# Patient Record
Sex: Female | Born: 1990 | Hispanic: No | Marital: Single | Smoking: Never smoker
Health system: Southern US, Community
[De-identification: ages and names within clinical notes are randomized; demographics above are authoritative.]

## PROBLEM LIST (undated history)

## (undated) ENCOUNTER — Inpatient Hospital Stay (HOSPITAL_COMMUNITY): Payer: Self-pay

## (undated) DIAGNOSIS — M8500 Fibrous dysplasia (monostotic), unspecified site: Secondary | ICD-10-CM

## (undated) DIAGNOSIS — D332 Benign neoplasm of brain, unspecified: Secondary | ICD-10-CM

## (undated) HISTORY — PX: OTHER SURGICAL HISTORY: SHX169

---
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
## 2011-02-08 ENCOUNTER — Encounter (HOSPITAL_COMMUNITY): Payer: Self-pay | Admitting: *Deleted

## 2011-02-08 ENCOUNTER — Emergency Department (INDEPENDENT_AMBULATORY_CARE_PROVIDER_SITE_OTHER)
Admission: EM | Admit: 2011-02-08 | Discharge: 2011-02-08 | Disposition: A | Discharge status: Home / Self Care | Payer: Medicaid Other | Source: Home / Self Care | Attending: Emergency Medicine | Admitting: Emergency Medicine

## 2011-02-08 DIAGNOSIS — J069 Acute upper respiratory infection, unspecified: Secondary | ICD-10-CM

## 2011-02-08 HISTORY — DX: Benign neoplasm of brain, unspecified: D33.2

## 2011-02-08 MED ORDER — IBUPROFEN 600 MG PO TABS: 600.0000 mg | ORAL_TABLET | Freq: Four times a day (QID) | ORAL | Status: AC | PRN

## 2011-02-08 MED ORDER — ALBUTEROL SULFATE HFA 108 (90 BASE) MCG/ACT IN AERS: 1.0000 | INHALATION_SPRAY | Freq: Four times a day (QID) | RESPIRATORY_TRACT | Status: DC | PRN

## 2011-02-08 MED ORDER — HYDROCODONE-HOMATROPINE 5-1.5 MG/5ML PO SYRP: 5.0000 mL | ORAL_SOLUTION | Freq: Four times a day (QID) | ORAL | Status: AC | PRN

## 2011-02-08 MED ORDER — FLUTICASONE PROPIONATE 50 MCG/ACT NA SUSP: 2.0000 | Freq: Every day | NASAL | Status: DC

## 2011-02-08 MED ORDER — PSEUDOEPHEDRINE-GUAIFENESIN ER 120-1200 MG PO TB12: 1.0000 | ORAL_TABLET | Freq: Two times a day (BID) | ORAL | Status: DC | PRN

## 2011-02-08 NOTE — ED Notes (Signed)
Pt  Has  Symptoms  Of  Cough  /  Congestion   Stuffy  Nose   As  Well  As  Sinus  Drainage           Symptoms  Of        Nasal  stuffyness  As  Well -  The  Onset  Of  Symptoms  Began      Yesterday        She  Is  Sitting  Upright on  Exam  Table  And  Appears  In no  Distress

## 2011-02-08 NOTE — ED Provider Notes (Signed)
History     CSN: [id number]  Arrival date & time 02/08/11  1438   First MD Initiated Contact with Patient 02/08/11 1548      Chief Complaint  Patient presents with  . Cough    (Consider location/radiation/quality/duration/timing/severity/associated sxs/prior treatment) HPI Comments: Patient with nasal congestion, rhinorrhea, postnasal drip, sore, irritated throat, nonproductive cough starting yesterday. Complains of general malaise, fatigue. Unable to sleep at night secondary to all of coughing. No wheezing, chest pain, shortness of breath. No ear pain, ear fullness. No fevers, abdominal pain. Patient did not get flu shot this year. Tried DayQuil, NyQuil with temporary relief. No known sick contacts.  ROS as noted in HPI. All other ROS negative.   Patient is a 21 y.o. female presenting with cough. The history is provided by the patient.  Cough This is a new problem. The current episode started yesterday. The cough is non-productive. There has been no fever. She has tried cough syrup for the symptoms. The treatment provided mild relief. She is not a smoker.    Past Medical History  Diagnosis Date  . Asthma   . Brain tumor (benign)     Past Surgical History  Procedure Date  . Cranial surgery     History reviewed. No pertinent family history.  History  Substance Use Topics  . Smoking status: Not on file  . Smokeless tobacco: Not on file  . Alcohol Use: No    OB History    Grav Para Term Preterm Abortions TAB SAB Ect Mult Living                  Review of Systems  Respiratory: Positive for cough.     Allergies  Review of patient's allergies indicates no known allergies.  Home Medications   Current Outpatient Rx  Name Route Sig Dispense Refill  . FLUTICASONE PROPIONATE 50 MCG/ACT NA SUSP Nasal Place 2 sprays into the nose daily. 16 g 0  . HYDROCODONE-HOMATROPINE 5-1.5 MG/5ML PO SYRP Oral Take 5 mLs by mouth every 6 (six) hours as needed for cough or pain. 120  mL 0  . IBUPROFEN 600 MG PO TABS Oral Take 1 tablet (600 mg total) by mouth every 6 (six) hours as needed for pain. 30 tablet 0  . PSEUDOEPHEDRINE-GUAIFENESIN ER [phone] MG PO TB12 Oral Take 1 tablet by mouth 2 (two) times daily as needed (congestion). 20 each 0    BP 123/84  Pulse 94  Temp(Src) 99.2 F (37.3 C) (Oral)  Resp 18  SpO2 99%  LMP 01/20/2011  Physical Exam  Nursing note and vitals reviewed. Constitutional: She is oriented to person, place, and time. She appears well-developed and well-nourished.  HENT:  Head: Normocephalic and atraumatic.  Right Ear: Tympanic membrane and ear canal normal.  Left Ear: Tympanic membrane and ear canal normal.  Nose: Mucosal edema and rhinorrhea present. No epistaxis.  Mouth/Throat: Uvula is midline and mucous membranes are normal. Posterior oropharyngeal erythema present. No oropharyngeal exudate.       (-) frontal, maxillary sinus tenderness  Eyes: Conjunctivae and EOM are normal. Pupils are equal, round, and reactive to light.  Neck: Normal range of motion. Neck supple.  Cardiovascular: Normal rate, regular rhythm and normal heart sounds.   Pulmonary/Chest: Effort normal and breath sounds normal. No respiratory distress. She has no wheezes. She has no rales. She exhibits no tenderness.  Abdominal: Soft. Bowel sounds are normal. She exhibits no distension. There is no tenderness.  Musculoskeletal: Normal range of motion.  Lymphadenopathy:    She has no cervical adenopathy.  Neurological: She is alert and oriented to person, place, and time.  Skin: Skin is warm and dry. No rash noted.  Psychiatric: She has a normal mood and affect. Her behavior is normal. Judgment and thought content normal.    ED Course  Procedures (including critical care time)  Labs Reviewed - No data to display No results found.   1. URI (upper respiratory infection)       MDM  Patient denies possibility of being pregnant. Will send her home with  albuterol inhaler in addition to decongestions, nasal steroids,. patient with history of asthma, and does not have an albuterol inhaler at home.  Luiz Blare, MD 02/08/11 [phone]

## 2011-06-07 ENCOUNTER — Telehealth: Payer: Self-pay | Admitting: *Deleted

## 2011-06-07 ENCOUNTER — Encounter: Payer: Self-pay | Admitting: Family Medicine

## 2011-06-07 ENCOUNTER — Ambulatory Visit (INDEPENDENT_AMBULATORY_CARE_PROVIDER_SITE_OTHER): Payer: Medicaid Other | Admitting: Family Medicine

## 2011-06-07 VITALS — BP 121/81 | HR 82 | Temp 99.0°F | Ht 66.61 in | Wt 145.4 lb

## 2011-06-07 DIAGNOSIS — G43909 Migraine, unspecified, not intractable, without status migrainosus: Secondary | ICD-10-CM

## 2011-06-07 DIAGNOSIS — R55 Syncope and collapse: Secondary | ICD-10-CM

## 2011-06-07 DIAGNOSIS — Z23 Encounter for immunization: Secondary | ICD-10-CM

## 2011-06-07 LAB — BASIC METABOLIC PANEL
CO2: 27 mEq/L (ref 19–32)
Chloride: 103 mEq/L (ref 96–112)
Sodium: 139 mEq/L (ref 135–145)

## 2011-06-07 LAB — CBC
MCV: 82.9 fL (ref 78.0–100.0)
Platelets: 317 10*3/uL (ref 150–400)

## 2011-06-07 MED ORDER — SUMATRIPTAN SUCCINATE 50 MG PO TABS: 50.0000 mg | ORAL_TABLET | ORAL | Status: DC | PRN

## 2011-06-07 NOTE — Assessment & Plan Note (Addendum)
One episode with hot sensation while prolonged standing and having hair cut- likely vasovagal- no headache assoc. She has no cardiac history and has no palpitations. Never has syncope or dizziness prior. Will watch this for now, if it happens again may need to do a cardiac workup.

## 2011-06-07 NOTE — Telephone Encounter (Signed)
PA required for sumatriptan. Form placed in MD box. 

## 2011-06-07 NOTE — Assessment & Plan Note (Signed)
Classic migraine symptoms and headache, Advised on abortive medications Reviewed migraine triggers Will rx imitrex

## 2011-06-07 NOTE — Patient Instructions (Signed)
It was great to see you today!  Schedule an appointment to see me in 2 weeks.  I think you have migraine headaches.  I have checked your lab levels for your blood count and your metabolic panel because of your fainting.  I have given you the tetanus vaccine today.

## 2011-06-08 ENCOUNTER — Encounter: Payer: Self-pay | Admitting: Family Medicine

## 2011-06-08 NOTE — Telephone Encounter (Signed)
PA approval faxed to pharmacy

## 2011-06-08 NOTE — Progress Notes (Signed)
  Subjective:  1. Migraine headache (first visit)  Maria Combs is a 21 y.o. female who presents for evaluation of headache. Symptoms began about 2 weeks ago. Generally, the headaches last about 2 hours and occur every day. The headaches do not seem to be related to any time of the day. The headaches are usually throbbing and are located in right temporal region.  The patient rates her most severe headaches a 8 on a scale from 1 to 10. Recently, the headaches have been increasing in frequency and severity. Work attendance or other daily activities are not affected by the headaches. Precipitating factors include: none which have been determined and asked about caffeine, sleep, nsaid use, alcohol use, smoking use. . The headaches are usually preceded by an aura consisting of she wasnt sure on this. Associated neurologic symptoms: none. The patient denies dizziness, loss of balance, numbness of extremities, speech difficulties, vision problems and vomiting in the early morning. Home treatment has included acetaminophen and ibuprofen with little improvement. Other history includes: nothing pertinent. Family history includes no known family members with significant headaches. She has a childhood history of intracranial surgery with acrylic plate under scalp (but this has never been a factor for headaches or seizures.- vague history without records) Headaches associated with photophobia, loud noise sensitivity, nausea.  Review of Systems Pertinent items are noted in HPI.    Objective:   Filed Vitals:   06/07/11 1141  BP: 121/81  Pulse: 82  Temp: 99 F (37.2 C)  TempSrc: Oral  Height: 5' 6.61" (1.692 m)  Weight: 145 lb 6 oz (65.942 kg)  Lungs:  Normal respiratory effort, chest expands symmetrically. Lungs are clear to auscultation, no crackles or wheezes. Heart - Regular rate and rhythm.  No murmurs, gallops or rubs.    Neck:  No deformities, thyromegaly, masses, or tenderness noted.   Supple  with full range of motion without pain. Fundi: normal, no vessel abnormalities. Neurologic exam : Cn 2-7 intact Strength equal & normal in upper & lower extremities Able to walk on heels and toes.   Balance normal  Romberg normal, finger to nose  EOMI, PERRLA  Assessment:

## 2011-06-21 ENCOUNTER — Ambulatory Visit: Payer: Medicaid Other | Admitting: Family Medicine

## 2012-02-29 ENCOUNTER — Encounter (HOSPITAL_COMMUNITY): Payer: Self-pay | Admitting: *Deleted

## 2012-02-29 ENCOUNTER — Emergency Department (INDEPENDENT_AMBULATORY_CARE_PROVIDER_SITE_OTHER)
Admission: EM | Admit: 2012-02-29 | Discharge: 2012-02-29 | Disposition: A | Discharge status: Home / Self Care | Payer: Medicaid Other | Source: Home / Self Care | Attending: Family Medicine | Admitting: Family Medicine

## 2012-02-29 DIAGNOSIS — J069 Acute upper respiratory infection, unspecified: Secondary | ICD-10-CM

## 2012-02-29 MED ORDER — IPRATROPIUM BROMIDE 0.06 % NA SOLN: 2.0000 | Freq: Four times a day (QID) | NASAL | Status: DC

## 2012-02-29 MED ORDER — ALBUTEROL SULFATE HFA 108 (90 BASE) MCG/ACT IN AERS: 1.0000 | INHALATION_SPRAY | Freq: Four times a day (QID) | RESPIRATORY_TRACT | Status: DC | PRN

## 2012-02-29 MED ORDER — HYDROCOD POLST-CHLORPHEN POLST 10-8 MG/5ML PO LQCR: 5.0000 mL | Freq: Two times a day (BID) | ORAL | Status: DC | PRN

## 2012-02-29 NOTE — ED Notes (Signed)
Pt  Reports   Symptoms  Of  A  Cough    As well  As  Headache  /  Nausea            With  Chest  Pain associated    with the  Cough  At intervals     -   She  Reports  A  History  Of  Ar=sthma   In past   However she  Appears  In no  Acute  Distress

## 2012-02-29 NOTE — ED Provider Notes (Signed)
History     CSN: [id number]  Arrival date & time 02/29/12  1406   First MD Initiated Contact with Patient 02/29/12 1421      Chief Complaint  Patient presents with  . Cough    (Consider location/radiation/quality/duration/timing/severity/associated sxs/prior treatment) Patient is a 22 y.o. female presenting with cough. The history is provided by the patient.  Cough Cough characteristics:  Non-productive Severity:  Mild Duration:  2 weeks Progression:  Unchanged Chronicity:  New Smoker: no   Associated symptoms: chest pain and rhinorrhea   Associated symptoms: no fever and no shortness of breath     Past Medical History  Diagnosis Date  . Asthma   . Brain tumor (benign)     Past Surgical History  Procedure Laterality Date  . Cranial surgery      No family history on file.  History  Substance Use Topics  . Smoking status: Never Smoker   . Smokeless tobacco: Not on file  . Alcohol Use: No    OB History   Grav Para Term Preterm Abortions TAB SAB Ect Mult Living                  Review of Systems  Constitutional: Negative.  Negative for fever.  HENT: Positive for congestion, rhinorrhea and postnasal drip.   Respiratory: Positive for cough. Negative for shortness of breath.   Cardiovascular: Positive for chest pain.  Gastrointestinal: Negative.     Allergies  Review of patient's allergies indicates no known allergies.  Home Medications   Current Outpatient Rx  Name  Route  Sig  Dispense  Refill  . EXPIRED: albuterol (PROVENTIL HFA;VENTOLIN HFA) 108 (90 BASE) MCG/ACT inhaler   Inhalation   Inhale 1-2 puffs into the lungs every 6 (six) hours as needed for wheezing.   1 Inhaler   0   . albuterol (PROVENTIL HFA;VENTOLIN HFA) 108 (90 BASE) MCG/ACT inhaler   Inhalation   Inhale 1-2 puffs into the lungs every 6 (six) hours as needed for wheezing.   1 Inhaler   0   . chlorpheniramine-HYDROcodone (TUSSIONEX PENNKINETIC ER) 10-8 MG/5ML LQCR   Oral  Take 5 mLs by mouth every 12 (twelve) hours as needed.   115 mL   0   . EXPIRED: fluticasone (FLONASE) 50 MCG/ACT nasal spray   Nasal   Place 2 sprays into the nose daily.   16 g   0   . ipratropium (ATROVENT) 0.06 % nasal spray   Nasal   Place 2 sprays into the nose 4 (four) times daily.   15 mL   1   . Pseudoephedrine-Guaifenesin (MUCINEX D) [phone] MG TB12   Oral   Take 1 tablet by mouth 2 (two) times daily as needed (congestion).   20 each   0   . SUMAtriptan (IMITREX) 50 MG tablet   Oral   Take 1 tablet (50 mg total) by mouth every 2 (two) hours as needed for migraine.   30 tablet   1     BP 117/82  Pulse 84  Temp(Src) 99.4 F (37.4 C) (Oral)  Resp 18  SpO2 98%  Physical Exam  Nursing note and vitals reviewed. Constitutional: She is oriented to person, place, and time. She appears well-developed and well-nourished.  HENT:  Head: Normocephalic.  Right Ear: External ear normal.  Left Ear: External ear normal.  Nose: Nose normal.  Mouth/Throat: Oropharynx is clear and moist.  Eyes: Conjunctivae are normal. Pupils are equal, round, and reactive to light.  Neck: Normal range of motion. Neck supple.  Cardiovascular: Normal rate, regular rhythm, normal heart sounds and intact distal pulses.   Pulmonary/Chest: Effort normal and breath sounds normal.  Lymphadenopathy:    She has no cervical adenopathy.  Neurological: She is alert and oriented to person, place, and time.  Skin: Skin is warm and dry.    ED Course  Procedures (including critical care time)  Labs Reviewed - No data to display No results found.   1. URI (upper respiratory infection)       MDM          Linna Hoff, MD 02/29/12 1529

## 2012-08-12 ENCOUNTER — Encounter: Payer: Self-pay | Admitting: Family Medicine

## 2012-08-12 ENCOUNTER — Other Ambulatory Visit (HOSPITAL_COMMUNITY)
Admission: RE | Admit: 2012-08-12 | Discharge: 2012-08-12 | Disposition: A | Discharge status: Home / Self Care | Payer: Medicaid Other | Source: Ambulatory Visit | Attending: Family Medicine | Admitting: Family Medicine

## 2012-08-12 ENCOUNTER — Ambulatory Visit (INDEPENDENT_AMBULATORY_CARE_PROVIDER_SITE_OTHER): Payer: Medicaid Other | Admitting: Family Medicine

## 2012-08-12 VITALS — BP 124/71 | HR 76 | Temp 98.3°F | Wt 149.0 lb

## 2012-08-12 DIAGNOSIS — Z0000 Encounter for general adult medical examination without abnormal findings: Secondary | ICD-10-CM

## 2012-08-12 DIAGNOSIS — Z113 Encounter for screening for infections with a predominantly sexual mode of transmission: Secondary | ICD-10-CM | POA: Insufficient documentation

## 2012-08-12 DIAGNOSIS — Z309 Encounter for contraceptive management, unspecified: Secondary | ICD-10-CM

## 2012-08-12 DIAGNOSIS — Z01419 Encounter for gynecological examination (general) (routine) without abnormal findings: Secondary | ICD-10-CM

## 2012-08-12 MED ORDER — ALBUTEROL SULFATE HFA 108 (90 BASE) MCG/ACT IN AERS: 1.0000 | INHALATION_SPRAY | Freq: Four times a day (QID) | RESPIRATORY_TRACT | Status: DC | PRN

## 2012-08-12 MED ORDER — MEDROXYPROGESTERONE ACETATE 150 MG/ML IM SUSP
150.0000 mg | Freq: Once | INTRAMUSCULAR | Status: AC
  Administered 2012-08-12: 150 mg via INTRAMUSCULAR

## 2012-08-12 NOTE — Addendum Note (Signed)
Addended by: Henri Medal on: 08/12/2012 10:46 AM   Modules accepted: Orders

## 2012-08-12 NOTE — Progress Notes (Signed)
Patient ID: Maria Combs, female   DOB: Dec 08, 1990, 22 y.o.   MRN: [id number] Subjective:     Maria Combs is a 22 y.o. female and is here for a comprehensive physical exam. The patient reports no problems.  LMP: Usually regular but started earlier 07/27/12. OB/GYN:  G0P0. Sexually active for 2 yrs, uses condoms regularly. Birth control:was on a pill 6 months ago,stopped since it made her feel sick,usually forget to take medication. Had Implanon in the past,made her feel weak.She will like to try Depo shot. No prior STI,UTI 2 yrs ago. Prior hx of vaginal discharge but non currently. She also endorsed pelvic pain on and off not related to her menses,currently denies any pain. She has had episodes of rectal pain with her menses as well.  She is studying accounting and business admin in school. No form of abuse,no stress.  History   Social History  . Marital Status: Single    Spouse Name: N/A    Number of Children: N/A  . Years of Education: N/A   Occupational History  . Not on file.   Social History Main Topics  . Smoking status: Never Smoker   . Smokeless tobacco: Not on file  . Alcohol Use: No  . Drug Use: Not on file  . Sexually Active: Yes    Birth Control/ Protection: None   Other Topics Concern  . Not on file   Social History Narrative  . No narrative on file   Health Maintenance  Topic Date Due  . Chlamydia Screening  11/29/2006  . Pap Smear  06/07/2013  . Influenza Vaccine  09/15/2012  . Tetanus/tdap  06/06/2021    The following portions of the patient's history were reviewed and updated as appropriate: allergies, current medications, past family history, past medical history, past social history, past surgical history and problem list.  Review of Systems Pertinent items are noted in HPI.   Objective:    BP 124/71  Pulse 76  Temp(Src) 98.3 F (36.8 C) (Oral)  Wt 149 lb (67.586 kg)  BMI 23.61 kg/m2  LMP 08/05/2012 General appearance: alert,  cooperative and appears stated age Head: Normocephalic, without obvious abnormality, atraumatic Eyes: conjunctivae/corneas clear. PERRL, EOM's intact. Fundi benign. Ears: normal TM's and external ear canals both ears Nose: Nares normal. Septum midline. Mucosa normal. No drainage or sinus tenderness. Throat: lips, mucosa, and tongue normal; teeth and gums normal Neck: no adenopathy, no carotid bruit, no JVD, supple, symmetrical, trachea midline and thyroid not enlarged, symmetric, no tenderness/mass/nodules Lungs: clear to auscultation bilaterally Heart: regular rate and rhythm, S1, S2 normal, no murmur, click, rub or gallop Abdomen: soft, non-tender; bowel sounds normal; no masses,  no organomegaly Pelvic: deferred and prefer urine specimen for GC/Chlamydia testing. Pelvic exam offered. Extremities: extremities normal, atraumatic, no cyanosis or edema Skin: Skin color, texture, turgor normal. No rashes or lesions Neurologic: Grossly normal    Assessment:    Healthy female exam. Normal exam      Plan:  Counseling done on birth control, she prefer to start Depo. S/E of birth control discussed and need to use alternative birth control for about 1-2 wks. Pregnancy test done today was negative. Depo shot given. STI counseling done, she agreed to HIV,RPR,GC/Chlamydia testing today. Pelvic pain and rectal pain might be ovarian cyst vs endometriosis, we will readdress this at next visit if persistent. F/U in 3 month prn for Depo shot. Next Depo shot should be between Sept 22- Oct 6.   See  After Visit Summary for Counseling Recommendations

## 2012-08-12 NOTE — Patient Instructions (Addendum)
Medroxyprogesterone injection [Contraceptive]  Next Depo shot should be between Sept 22- Oct 6. What is this medicine? MEDROXYPROGESTERONE (me DROX ee proe JES te rone) contraceptive injections prevent pregnancy. They provide effective birth control for 3 months. Depo-subQ Provera 104 is also used for treating pain related to endometriosis. This medicine may be used for other purposes; ask your health care provider or pharmacist if you have questions. What should I tell my health care provider before I take this medicine? They need to know if you have any of these conditions: -frequently drink alcohol -asthma -blood vessel disease or a history of a blood clot in the lungs or legs -bone disease such as osteoporosis -breast cancer -diabetes -eating disorder (anorexia nervosa or bulimia) -high blood pressure -HIV infection or AIDS -kidney disease -liver disease -mental depression -migraine -seizures (convulsions) -stroke -tobacco smoker -vaginal bleeding -an unusual or allergic reaction to medroxyprogesterone, other hormones, medicines, foods, dyes, or preservatives -pregnant or trying to get pregnant -breast-feeding How should I use this medicine? Depo-Provera Contraceptive injection is given into a muscle. Depo-subQ Provera 104 injection is given under the skin. These injections are given by a health care professional. You must not be pregnant before getting an injection. The injection is usually given during the first 5 days after the start of a menstrual period or 6 weeks after delivery of a baby. Talk to your pediatrician regarding the use of this medicine in children. Special care may be needed. These injections have been used in female children who have started having menstrual periods. Overdosage: If you think you have taken too much of this medicine contact a poison control center or emergency room at once. NOTE: This medicine is only for you. Do not share this medicine with  others. What if I miss a dose? Try not to miss a dose. You must get an injection once every 3 months to maintain birth control. If you cannot keep an appointment, call and reschedule it. If you wait longer than 13 weeks between Depo-Provera contraceptive injections or longer than 14 weeks between Depo-subQ Provera 104 injections, you could get pregnant. Use another method for birth control if you miss your appointment. You may also need a pregnancy test before receiving another injection. What may interact with this medicine? Do not take this medicine with any of the following medications: -bosentan This medicine may also interact with the following medications: -aminoglutethimide -antibiotics or medicines for infections, especially rifampin, rifabutin, rifapentine, and griseofulvin -aprepitant -barbiturate medicines such as phenobarbital or primidone -bexarotene -carbamazepine -medicines for seizures like ethotoin, felbamate, oxcarbazepine, phenytoin, topiramate -modafinil -St. John's wort This list may not describe all possible interactions. Give your health care provider a list of all the medicines, herbs, non-prescription drugs, or dietary supplements you use. Also tell them if you smoke, drink alcohol, or use illegal drugs. Some items may interact with your medicine. What should I watch for while using this medicine? This drug does not protect you against HIV infection (AIDS) or other sexually transmitted diseases. Use of this product may cause you to lose calcium from your bones. Loss of calcium may cause weak bones (osteoporosis). Only use this product for more than 2 years if other forms of birth control are not right for you. The longer you use this product for birth control the more likely you will be at risk for weak bones. Ask your health care professional how you can keep strong bones. You may have a change in bleeding pattern or irregular periods. Many  females stop having periods  while taking this drug. If you have received your injections on time, your chance of being pregnant is very low. If you think you may be pregnant, see your health care professional as soon as possible. Tell your health care professional if you want to get pregnant within the next year. The effect of this medicine may last a long time after you get your last injection. What side effects may I notice from receiving this medicine? Side effects that you should report to your doctor or health care professional as soon as possible: -allergic reactions like skin rash, itching or hives, swelling of the face, lips, or tongue -breast tenderness or discharge -breathing problems -changes in vision -depression -feeling faint or lightheaded, falls -fever -pain in the abdomen, chest, groin, or leg -problems with balance, talking, walking -unusually weak or tired -yellowing of the eyes or skin Side effects that usually do not require medical attention (report to your doctor or health care professional if they continue or are bothersome): -acne -fluid retention and swelling -headache -irregular periods, spotting, or absent periods -temporary pain, itching, or skin reaction at site where injected -weight gain This list may not describe all possible side effects. Call your doctor for medical advice about side effects. You may report side effects to FDA at 1-800-FDA-1088. Where should I keep my medicine? This does not apply. The injection will be given to you by a health care professional. NOTE: This sheet is a summary. It may not cover all possible information. If you have questions about this medicine, talk to your doctor, pharmacist, or health care provider.  2013, [employer]. (01/23/2008 6:37:56 PM)

## 2012-08-13 LAB — RPR

## 2012-09-25 ENCOUNTER — Ambulatory Visit (INDEPENDENT_AMBULATORY_CARE_PROVIDER_SITE_OTHER): Payer: Medicaid Other | Admitting: Family Medicine

## 2012-09-25 ENCOUNTER — Encounter: Payer: Self-pay | Admitting: Family Medicine

## 2012-09-25 VITALS — BP 115/77 | HR 89 | Temp 98.9°F | Ht 66.0 in | Wt 154.0 lb

## 2012-09-25 DIAGNOSIS — R109 Unspecified abdominal pain: Secondary | ICD-10-CM

## 2012-09-25 DIAGNOSIS — H60399 Other infective otitis externa, unspecified ear: Secondary | ICD-10-CM

## 2012-09-25 DIAGNOSIS — R51 Headache: Secondary | ICD-10-CM

## 2012-09-25 DIAGNOSIS — H60391 Other infective otitis externa, right ear: Secondary | ICD-10-CM

## 2012-09-25 DIAGNOSIS — Z23 Encounter for immunization: Secondary | ICD-10-CM

## 2012-09-25 LAB — POCT URINALYSIS DIPSTICK
Glucose, UA: NEGATIVE
Ketones, UA: NEGATIVE
Leukocytes, UA: NEGATIVE
Nitrite, UA: NEGATIVE
Protein, UA: NEGATIVE
Spec Grav, UA: 1.01
Urobilinogen, UA: 0.2

## 2012-09-25 LAB — POCT UA - MICROSCOPIC ONLY

## 2012-09-25 MED ORDER — MELOXICAM 7.5 MG PO TABS: 7.5000 mg | ORAL_TABLET | Freq: Every day | ORAL | Status: DC

## 2012-09-25 MED ORDER — CIPROFLOXACIN-DEXAMETHASONE 0.3-0.1 % OT SUSP: 4.0000 [drp] | Freq: Two times a day (BID) | OTIC | Status: DC

## 2012-09-25 NOTE — Assessment & Plan Note (Signed)
Currently asymptomatic. Might be related to stress vs otalgia induced headache. Tylenol prn headache.

## 2012-09-25 NOTE — Progress Notes (Signed)
Subjective:     Patient ID: Maria Combs, female   DOB: August 19, 1990, 22 y.o.   MRN: [id number]  HPI Back pain: Burning sensation on both flanks,more than 3wks ago,about 4/10 in severity.No fall or injury. Trigger unknown. No urine symptoms. No fever,no vaginal discharge.Vibration while driving.Ibuprofen 400 mg did not help. Headache:Few days ago.Not currently,nauseous 2 days ago,no vomiting.Her sister has stomach virus. Earache: Right earache for 4wks on and off. Not current.Denies hearing loss,no ear discharge,no fever. Vaccination: need flu shot.  Current Outpatient Prescriptions on File Prior to Visit  Medication Sig Dispense Refill  . albuterol (PROVENTIL HFA;VENTOLIN HFA) 108 (90 BASE) MCG/ACT inhaler Inhale 1-2 puffs into the lungs every 6 (six) hours as needed for wheezing.  1 Inhaler  5  . [DISCONTINUED] clindamycin-benzoyl peroxide (BENZACLIN) gel Apply topically 2 (two) times daily. to affected areas for acne       . [DISCONTINUED] Eflornithine HCl (VANIQA) 13.9 % cream Apply topically 2 (two) times daily. to affected area at least 8 hours apart       . [DISCONTINUED] Etonogestrel (IMPLANON) 68 MG IMPL Inject into the skin.         No current facility-administered medications on file prior to visit.   Past Medical History  Diagnosis Date  . Brain tumor (benign)   . Asthma      Review of Systems  Respiratory: Negative.   Cardiovascular: Negative.   Gastrointestinal: Negative.   Genitourinary: Positive for flank pain. Negative for dysuria, frequency and hematuria.  All other systems reviewed and are negative.   Filed Vitals:   09/25/12 1441  BP: 115/77  Pulse: 89  Temp: 98.9 F (37.2 C)  TempSrc: Oral  Height: 5\' 6"  (1.676 m)  Weight: 154 lb (69.854 kg)       Objective:   Physical Exam  Nursing note and vitals reviewed. Constitutional: She appears well-developed. No distress.  HENT:  Head: Normocephalic.  Right Ear: Tympanic membrane normal. There is  drainage and tenderness.  Left Ear: Tympanic membrane normal. No drainage.  Ears:  Cardiovascular: Normal rate, regular rhythm, normal heart sounds and intact distal pulses.   No murmur heard. Pulmonary/Chest: Effort normal and breath sounds normal. No respiratory distress. She has no wheezes.  Abdominal: Soft. Bowel sounds are normal. She exhibits no distension and no mass. There is no splenomegaly or hepatomegaly. There is no tenderness. There is no guarding and no CVA tenderness.       Assessment/Plan:     Flank pain: R/O Kidney infection although unlikely. Headache : currently asymptomatic Earache: Otitis externa  Influenza vaccine

## 2012-09-25 NOTE — Assessment & Plan Note (Signed)
To start Ciprodex. F/U prn.

## 2012-09-25 NOTE — Assessment & Plan Note (Signed)
R/O Kidney infection although unlikely.?? Muscle spasm. UA obtained,will treat if positive for infection. Mobic for pain. F/U in 2 wks if no improvement,to consider xray and PT then if no improvement.

## 2012-09-25 NOTE — Patient Instructions (Addendum)

## 2012-09-25 NOTE — Assessment & Plan Note (Signed)
Flu shot offered,patient agreed with shot. Will give today.

## 2012-09-25 NOTE — Addendum Note (Signed)
Addended by: [place], Alfretta Pinch on: 09/25/2012 03:33 PM   Modules accepted: Orders

## 2012-09-30 ENCOUNTER — Telehealth: Payer: Self-pay | Admitting: Family Medicine

## 2012-09-30 NOTE — Telephone Encounter (Signed)
After careful review of Dr. Phebe Colla notes from recent visit, it is unclear to me that MRI is indicated.  I ask that patient be seen and re-evaluated specifically for this problem. JB

## 2012-09-30 NOTE — Telephone Encounter (Signed)
The Melex[place] is not working. Maria Combs said if the medicine did not work, she would suggest a MRI Please advise

## 2012-09-30 NOTE — Telephone Encounter (Signed)
No answer and no machine.  Will await callback. Zaxton Angerer Dawn  

## 2012-11-14 ENCOUNTER — Ambulatory Visit (INDEPENDENT_AMBULATORY_CARE_PROVIDER_SITE_OTHER): Payer: Medicaid Other | Admitting: *Deleted

## 2012-11-14 DIAGNOSIS — Z309 Encounter for contraceptive management, unspecified: Secondary | ICD-10-CM

## 2012-11-14 MED ORDER — MEDROXYPROGESTERONE ACETATE 150 MG/ML IM SUSP
150.0000 mg | Freq: Once | INTRAMUSCULAR | Status: AC
  Administered 2012-11-14: 150 mg via INTRAMUSCULAR

## 2012-11-14 NOTE — Progress Notes (Signed)
Patient in today for depo. Patient 3 days late so a urine pregnancy test was obtained with negative results. Injection given in the left deltoid, no complaints noted, site unremarkable. Next depo due January 16 - January 30, patient aware.

## 2013-05-15 ENCOUNTER — Encounter: Payer: Self-pay | Admitting: Internal Medicine

## 2013-05-15 ENCOUNTER — Ambulatory Visit: Payer: Self-pay | Attending: Internal Medicine | Admitting: Internal Medicine

## 2013-05-15 VITALS — BP 106/70 | HR 73 | Temp 98.5°F | Resp 16 | Ht 66.5 in | Wt 173.0 lb

## 2013-05-15 DIAGNOSIS — J029 Acute pharyngitis, unspecified: Secondary | ICD-10-CM | POA: Insufficient documentation

## 2013-05-15 DIAGNOSIS — H6120 Impacted cerumen, unspecified ear: Secondary | ICD-10-CM | POA: Insufficient documentation

## 2013-05-15 DIAGNOSIS — H6122 Impacted cerumen, left ear: Secondary | ICD-10-CM

## 2013-05-15 LAB — POCT RAPID STREP A (OFFICE): Rapid Strep A Screen: NEGATIVE

## 2013-05-15 MED ORDER — PENICILLIN V POTASSIUM 500 MG PO TABS: 500.0000 mg | ORAL_TABLET | Freq: Two times a day (BID) | ORAL | Status: DC

## 2013-05-15 MED ORDER — PENICILLIN V POTASSIUM 250 MG PO TABS: 500.0000 mg | ORAL_TABLET | Freq: Two times a day (BID) | ORAL | Status: DC

## 2013-05-15 NOTE — Progress Notes (Signed)
Patient here to establish care. States 1 week history of sore throat and right earache.

## 2013-05-15 NOTE — Progress Notes (Signed)
Patient ID: Maria Combs, female   DOB: Jul 20, 1990, 23 y.o.   MRN: [id number]   EAR PAIN  Location: right  Ear pain started: 1 week Pain is: sharp stabbing pain  Severity: moderate  Medications tried: motrin, alka seltzer cold and flu Recent ear trauma: no Prior ear surgeries: no Antibiotics in the last 30 days: no History of diabetes: no  Symptoms Ear discharge: no Fever: yes Pain with chewing: no Ringing in ears: no Dizziness: no Hearing loss: no Rashes or blisters around ear: no Weight loss: no  SORE THROAT  Sore throat began 7 days ago. Pain is: dry Severity: moderate Medications tried: cold and flu meds Strep throat exposure: no STD exposure: no  Symptoms Fever: yes Cough: yes, occasional from post nasal drainage Runny nose: no Muscle aches: yes Swollen Glands: yes Trouble breathing: mild, swollen throat Drooling: no Weight loss: no LMP: 4/16  Review of Symptoms - see HPI PMH - Smoking status noted.    Physical Exam  Constitutional: She is well-developed, well-nourished, and in no distress.  HENT:  Head: Normocephalic.  Right Ear: External ear normal.  Mouth/Throat: Oropharyngeal exudate and posterior oropharyngeal erythema present.  Eyes: Conjunctivae are normal. Pupils are equal, round, and reactive to light. No scleral icterus.  Neck: Normal range of motion.  Cardiovascular: Normal rate, regular rhythm and normal heart sounds.   Pulmonary/Chest: Effort normal and breath sounds normal.  Abdominal: Soft. Bowel sounds are normal.  Lymphadenopathy:    She has cervical adenopathy.  Neurological: She is alert.  Skin: Skin is warm and dry. No rash noted.  Psychiatric: Mood and affect normal.   Anwita was seen today for establish care, sore throat and otalgia.  Diagnoses and associated orders for this visit:  Impacted cerumen of left ear - Ear Lavage - Rapid Strep A  Acute pharyngitis - penicillin v potassium (VEETID) 500 MG tablet; Take  1 tablet (500 mg total) by mouth 2 (two) times daily. Throat culture sent, will call with results.  May gargle in warm salt water to help with pain.    Return if symptoms worsen or fail to improve.  Chari Manning, NP 05/15/2013 12:42 PM

## 2013-05-15 NOTE — Patient Instructions (Signed)
Strep Throat  Strep throat is an infection of the throat caused by a bacteria named Streptococcus pyogenes. Your caregiver may call the infection streptococcal "tonsillitis" or "pharyngitis" depending on whether there are signs of inflammation in the tonsils or back of the throat. Strep throat is most common in children aged 22 15 years during the cold months of the year, but it can occur in people of any age during any season. This infection is spread from person to person (contagious) through coughing, sneezing, or other close contact.  SYMPTOMS   · Fever or chills.  · Painful, swollen, red tonsils or throat.  · Pain or difficulty when swallowing.  · White or yellow spots on the tonsils or throat.  · Swollen, tender lymph nodes or "glands" of the neck or under the jaw.  · Red rash all over the body (rare).  DIAGNOSIS   Many different infections can cause the same symptoms. A test must be done to confirm the diagnosis so the right treatment can be given. A "rapid strep test" can help your caregiver make the diagnosis in a few minutes. If this test is not available, a light swab of the infected area can be used for a throat culture test. If a throat culture test is done, results are usually available in a day or two.  TREATMENT   Strep throat is treated with antibiotic medicine.  HOME CARE INSTRUCTIONS   · Gargle with 1 tsp of salt in 1 cup of warm water, 3 4 times per day or as needed for comfort.  · Family members who also have a sore throat or fever should be tested for strep throat and treated with antibiotics if they have the strep infection.  · Make sure everyone in your household washes their hands well.  · Do not share food, drinking cups, or personal items that could cause the infection to spread to others.  · You may need to eat a soft food diet until your sore throat gets better.  · Drink enough water and fluids to keep your urine clear or pale yellow. This will help prevent dehydration.  · Get plenty of  rest.  · Stay home from school, daycare, or work until you have been on antibiotics for 24 hours.  · Only take over-the-counter or prescription medicines for pain, discomfort, or fever as directed by your caregiver.  · If antibiotics are prescribed, take them as directed. Finish them even if you start to feel better.  SEEK MEDICAL CARE IF:   · The glands in your neck continue to enlarge.  · You develop a rash, cough, or earache.  · You cough up green, yellow-brown, or bloody sputum.  · You have pain or discomfort not controlled by medicines.  · Your problems seem to be getting worse rather than better.  SEEK IMMEDIATE MEDICAL CARE IF:   · You develop any new symptoms such as vomiting, severe headache, stiff or painful neck, chest pain, shortness of breath, or trouble swallowing.  · You develop severe throat pain, drooling, or changes in your voice.  · You develop swelling of the neck, or the skin on the neck becomes red and tender.  · You have a fever.  · You develop signs of dehydration, such as fatigue, dry mouth, and decreased urination.  · You become increasingly sleepy, or you cannot wake up completely.  Document Released: 12/30/1999 Document Revised: 12/19/2011 Document Reviewed: 03/02/2010  ExitCare® Patient Information ©2014 [employer].

## 2013-05-28 ENCOUNTER — Ambulatory Visit: Payer: Self-pay | Attending: Internal Medicine

## 2013-06-03 NOTE — Telephone Encounter (Signed)
Error

## 2014-01-15 NOTE — L&D Delivery Note (Addendum)
Delivery Note At 5:16 PM a viable female, "[name]", was delivered via Vaginal, Spontaneous Delivery (Presentation: ; Occiput Anterior).  APGAR: 9, 9; weight  .   Placenta status: Intact, Spontaneous.  Cord:  with the following complications: None.  Cord pH: NA  Anesthesia: Epidural  Episiotomy: None Lacerations: Bilateral periurethral; right inner labia extending along right vaginal wall Suture Repair: 3.0 vicryl Est. Blood Loss (mL): 107  Mom to postpartum.  Baby to Couplet care / Skin to Skin. Undecided regarding contraception. Marginal insertion of cord noted, no avulsion.  Aundreya Souffrant, [place] 10/12/2014, 6:00 PM

## 2014-04-30 LAB — OB RESULTS CONSOLE ANTIBODY SCREEN: ANTIBODY SCREEN: NEGATIVE

## 2014-04-30 LAB — OB RESULTS CONSOLE ABO/RH: RH Type: POSITIVE

## 2014-04-30 LAB — OB RESULTS CONSOLE HIV ANTIBODY (ROUTINE TESTING): HIV: NONREACTIVE

## 2014-04-30 LAB — OB RESULTS CONSOLE GC/CHLAMYDIA
CHLAMYDIA, DNA PROBE: NEGATIVE
GC PROBE AMP, GENITAL: NEGATIVE

## 2014-04-30 LAB — OB RESULTS CONSOLE HEPATITIS B SURFACE ANTIGEN: HEP B S AG: NEGATIVE

## 2014-04-30 LAB — OB RESULTS CONSOLE RPR: RPR: NONREACTIVE

## 2014-04-30 LAB — OB RESULTS CONSOLE RUBELLA ANTIBODY, IGM: Rubella: IMMUNE

## 2014-05-03 ENCOUNTER — Emergency Department (HOSPITAL_COMMUNITY)
Admission: EM | Admit: 2014-05-03 | Discharge: 2014-05-03 | Disposition: A | Discharge status: Home / Self Care | Payer: Medicaid Other | Attending: Emergency Medicine | Admitting: Emergency Medicine

## 2014-05-03 ENCOUNTER — Encounter (HOSPITAL_COMMUNITY): Payer: Self-pay

## 2014-05-03 DIAGNOSIS — Z792 Long term (current) use of antibiotics: Secondary | ICD-10-CM | POA: Insufficient documentation

## 2014-05-03 DIAGNOSIS — O9989 Other specified diseases and conditions complicating pregnancy, childbirth and the puerperium: Secondary | ICD-10-CM | POA: Diagnosis not present

## 2014-05-03 DIAGNOSIS — O210 Mild hyperemesis gravidarum: Secondary | ICD-10-CM | POA: Diagnosis not present

## 2014-05-03 DIAGNOSIS — Z79899 Other long term (current) drug therapy: Secondary | ICD-10-CM | POA: Insufficient documentation

## 2014-05-03 DIAGNOSIS — O99512 Diseases of the respiratory system complicating pregnancy, second trimester: Secondary | ICD-10-CM | POA: Diagnosis not present

## 2014-05-03 DIAGNOSIS — J45909 Unspecified asthma, uncomplicated: Secondary | ICD-10-CM | POA: Diagnosis not present

## 2014-05-03 DIAGNOSIS — Z86011 Personal history of benign neoplasm of the brain: Secondary | ICD-10-CM | POA: Insufficient documentation

## 2014-05-03 DIAGNOSIS — Z3A16 16 weeks gestation of pregnancy: Secondary | ICD-10-CM | POA: Diagnosis not present

## 2014-05-03 DIAGNOSIS — R1030 Lower abdominal pain, unspecified: Secondary | ICD-10-CM

## 2014-05-03 DIAGNOSIS — J0100 Acute maxillary sinusitis, unspecified: Secondary | ICD-10-CM | POA: Insufficient documentation

## 2014-05-03 DIAGNOSIS — R1032 Left lower quadrant pain: Secondary | ICD-10-CM | POA: Insufficient documentation

## 2014-05-03 DIAGNOSIS — R1031 Right lower quadrant pain: Secondary | ICD-10-CM | POA: Insufficient documentation

## 2014-05-03 LAB — URINALYSIS, ROUTINE W REFLEX MICROSCOPIC
BILIRUBIN URINE: NEGATIVE
Glucose, UA: NEGATIVE mg/dL
HGB URINE DIPSTICK: NEGATIVE
Ketones, ur: >80 mg/dL — AB
LEUKOCYTES UA: NEGATIVE
NITRITE: NEGATIVE
PROTEIN: NEGATIVE mg/dL
SPECIFIC GRAVITY, URINE: 1.025 (ref 1.005–1.030)
UROBILINOGEN UA: 0.2 mg/dL (ref 0.0–1.0)
pH: 5.5 (ref 5.0–8.0)

## 2014-05-03 MED ORDER — ACETAMINOPHEN 500 MG PO TABS
1000.0000 mg | ORAL_TABLET | Freq: Once | ORAL | Status: AC
  Administered 2014-05-03: 1000 mg via ORAL
  Filled 2014-05-03: qty 2

## 2014-05-03 MED ORDER — SODIUM CHLORIDE 0.9 % IV BOLUS (SEPSIS)
1000.0000 mL | Freq: Once | INTRAVENOUS | Status: AC
  Administered 2014-05-03: 1000 mL via INTRAVENOUS

## 2014-05-03 MED ORDER — AZITHROMYCIN 250 MG PO TABS: 250.0000 mg | ORAL_TABLET | Freq: Every day | ORAL | Status: DC

## 2014-05-03 MED ORDER — AZITHROMYCIN 250 MG PO TABS
500.0000 mg | ORAL_TABLET | Freq: Once | ORAL | Status: AC
  Administered 2014-05-03: 500 mg via ORAL
  Filled 2014-05-03: qty 2

## 2014-05-03 MED ORDER — ALBUTEROL SULFATE HFA 108 (90 BASE) MCG/ACT IN AERS: 2.0000 | INHALATION_SPRAY | RESPIRATORY_TRACT | Status: AC | PRN

## 2014-05-03 NOTE — Discharge Instructions (Signed)
Please call your doctor for a followup appointment within 24-48 hours. When you talk to your doctor please let them know that you were seen in the emergency department and have them acquire all of your records so that they can discuss the findings with you and formulate a treatment plan to fully care for your new and ongoing problems. ° °

## 2014-05-03 NOTE — ED Notes (Signed)
Dr. Sabra Heck gives verbal order for 1L of normal saline.

## 2014-05-03 NOTE — ED Notes (Signed)
Pt states that she is pregnant (16 weeks) and has been experiencing abd pain and dry heaves.

## 2014-05-03 NOTE — ED Notes (Signed)
Pt started having green and yellow mucus from her nose last night. Has asthma and no inhaler.

## 2014-05-03 NOTE — ED Provider Notes (Signed)
CSN: [id number]     Arrival date & time 05/03/14  1413 History   First MD Initiated Contact with Patient 05/03/14 1610     Chief Complaint  Patient presents with  . URI  . Abdominal Pain  . Emesis During Pregnancy     (Consider location/radiation/quality/duration/timing/severity/associated sxs/prior Treatment) HPI Comments: 24 year old female, G1 P0 at 16 weeks who presents with a complaint of green and yellow discharge from her nose, feeling increased sinus pressure, mild headache and since being in the emergency department has developed lower abdominal discomfort with nausea. She denies dysuria, diarrhea, rectal bleeding, vaginal discharge bleeding or fluid leakage and has had no cough. The symptoms are persistent, nothing makes them better or worse, no associated fevers chills or myalgias. No known sick contacts. She is taking a prenatal vitamin, she had her initial ultrasound confirming intrauterine pregnancy but has not had her formal prenatal ultrasound  Patient is a 24 y.o. female presenting with URI and abdominal pain. The history is provided by the patient.  URI Abdominal Pain   Past Medical History  Diagnosis Date  . Brain tumor (benign)   . Asthma    Past Surgical History  Procedure Laterality Date  . Cranial surgery     Family History  Problem Relation Age of Onset  . Asthma Sister    History  Substance Use Topics  . Smoking status: Never Smoker   . Smokeless tobacco: Not on file  . Alcohol Use: No   OB History    Gravida Para Term Preterm AB TAB SAB Ectopic Multiple Living   1              Review of Systems  Gastrointestinal: Positive for abdominal pain.  All other systems reviewed and are negative.     Allergies  Review of patient's allergies indicates no known allergies.  Home Medications   Prior to Admission medications   Medication Sig Start Date End Date Taking? Authorizing Provider  Prenatal Vit-Fe Fumarate-FA (PRENATAL PO) Take 1 tablet by  mouth daily.   Yes Historical Provider, MD  Pseudoephedrine-DM-GG (ROBITUSSIN CF PO) Take 10 mLs by mouth every 4 (four) hours as needed (cough).   Yes Historical Provider, MD  albuterol (PROVENTIL HFA;VENTOLIN HFA) 108 (90 BASE) MCG/ACT inhaler Inhale 1-2 puffs into the lungs every 6 (six) hours as needed for wheezing. 08/12/12   Kinnie Feil, MD  azithromycin (ZITHROMAX Z-PAK) 250 MG tablet Take 1 tablet (250 mg total) by mouth daily. 500mg  PO day 1, then 250mg  PO days 205 05/03/14   Noemi Chapel, MD  ciprofloxacin-dexamethasone [facility]) otic suspension Place 4 drops into the right ear 2 (two) times daily. Patient not taking: Reported on 05/03/2014 09/25/12   Kinnie Feil, MD  meloxicam (MOBIC) 7.5 MG tablet Take 1 tablet (7.5 mg total) by mouth daily. Patient not taking: Reported on 05/03/2014 09/25/12   Kinnie Feil, MD  penicillin v potassium (VEETID) 500 MG tablet Take 1 tablet (500 mg total) by mouth 2 (two) times daily. Patient not taking: Reported on 05/03/2014 05/15/13   Lance Bosch, NP   BP 103/68 mmHg  Pulse 103  Temp(Src) 98.4 F (36.9 C) (Oral)  Resp 17  Ht 5\' 6"  (1.676 m)  Wt 138 lb (62.596 kg)  BMI 22.28 kg/m2  SpO2 98%  LMP 01/15/2014 Physical Exam  Constitutional: She appears well-developed and well-nourished. No distress.  HENT:  Head: Normocephalic and atraumatic.  Mouth/Throat: Oropharynx is clear and moist. No oropharyngeal exudate.  Purulent mucus from the bilateral nares, oropharynx clear and moist, no erythema  Eyes: Conjunctivae and EOM are normal. Pupils are equal, round, and reactive to light. Right eye exhibits no discharge. Left eye exhibits no discharge. No scleral icterus.  Neck: Normal range of motion. Neck supple. No JVD present. No thyromegaly present.  Cardiovascular: Normal rate, regular rhythm, normal heart sounds and intact distal pulses.  Exam reveals no gallop and no friction rub.   No murmur heard. Pulmonary/Chest: Effort normal and  breath sounds normal. No respiratory distress. She has no wheezes. She has no rales.  Abdominal: Soft. Bowel sounds are normal. She exhibits no distension and no mass. There is tenderness ( Mild lower abdominal tenderness, left lower, right lower, suprapubic).  No guarding, uterus below the umbilicus, no upper abdominal tenderness  Genitourinary:  Deferred  Musculoskeletal: Normal range of motion. She exhibits no edema or tenderness.  Lymphadenopathy:    She has no cervical adenopathy.  Neurological: She is alert. Coordination normal.  Skin: Skin is warm and dry. No rash noted. No erythema.  Psychiatric: She has a normal mood and affect. Her behavior is normal.  Nursing note and vitals reviewed.   ED Course  Procedures (including critical care time) Labs Review Labs Reviewed  URINALYSIS, ROUTINE W REFLEX MICROSCOPIC - Abnormal; Notable for the following:    Color, Urine AMBER (*)    APPearance CLOUDY (*)    Ketones, ur >80 (*)    All other components within normal limits    Imaging Review No results found.    MDM   Final diagnoses:  Acute maxillary sinusitis, recurrence not specified  Lower abdominal pain    The patient has no cardiac or pulmonary findings, likely has sinusitis, potential urinary tract infection as well. Bedside ultrasound reveals intrauterine pregnancy with heartbeat of approximately 140-150 bpm. There is positive fetal movements, the patient does not have any significant guarding, her tenderness is bilateral pelvic, no pelvic discharge. The patient will be tested for urinary tract infection, treated for sinusitis as well.  UA shows ketones - dehydration, no UTI, pt has non specific lower abd pain.  Tolerated sandwhich, fluids and zithromax prior to d/c.  intstructions to take tylenol only, pt expresses understanding.  Meds given in ED:  Medications  azithromycin (ZITHROMAX) tablet 500 mg (not administered)  acetaminophen (TYLENOL) tablet 1,000 mg  (1,000 mg Oral Given 05/03/14 1923)  sodium chloride 0.9 % bolus 1,000 mL (1,000 mLs Intravenous New Bag/Given 05/03/14 2027)    New Prescriptions   AZITHROMYCIN (ZITHROMAX Z-PAK) 250 MG TABLET    Take 1 tablet (250 mg total) by mouth daily. 500mg  PO day 1, then 250mg  PO days 205        Noemi Chapel, MD 05/03/14 2030

## 2014-05-24 LAB — US OB COMP + 14 WK

## 2014-06-21 ENCOUNTER — Other Ambulatory Visit (HOSPITAL_COMMUNITY): Payer: Self-pay | Admitting: Obstetrics and Gynecology

## 2014-06-21 DIAGNOSIS — M8500 Fibrous dysplasia (monostotic), unspecified site: Secondary | ICD-10-CM

## 2014-06-21 DIAGNOSIS — O283 Abnormal ultrasonic finding on antenatal screening of mother: Secondary | ICD-10-CM

## 2014-06-29 ENCOUNTER — Encounter (HOSPITAL_COMMUNITY): Payer: Self-pay

## 2014-06-29 ENCOUNTER — Ambulatory Visit (HOSPITAL_COMMUNITY)
Admission: RE | Admit: 2014-06-29 | Discharge: 2014-06-29 | Disposition: A | Discharge status: Home / Self Care | Payer: Medicaid Other | Source: Ambulatory Visit | Attending: Obstetrics and Gynecology | Admitting: Obstetrics and Gynecology

## 2014-06-29 DIAGNOSIS — Z36 Encounter for antenatal screening of mother: Secondary | ICD-10-CM | POA: Insufficient documentation

## 2014-06-29 DIAGNOSIS — Z3A24 24 weeks gestation of pregnancy: Secondary | ICD-10-CM | POA: Diagnosis not present

## 2014-06-29 DIAGNOSIS — O283 Abnormal ultrasonic finding on antenatal screening of mother: Secondary | ICD-10-CM

## 2014-06-29 DIAGNOSIS — M8500 Fibrous dysplasia (monostotic), unspecified site: Secondary | ICD-10-CM

## 2014-06-29 DIAGNOSIS — O352XX0 Maternal care for (suspected) hereditary disease in fetus, not applicable or unspecified: Secondary | ICD-10-CM | POA: Insufficient documentation

## 2014-06-29 HISTORY — DX: Fibrous dysplasia (monostotic), unspecified site: M85.00

## 2014-07-02 ENCOUNTER — Encounter (HOSPITAL_COMMUNITY): Payer: Self-pay | Admitting: Obstetrics and Gynecology

## 2014-09-21 LAB — OB RESULTS CONSOLE GBS: STREP GROUP B AG: POSITIVE

## 2014-10-12 ENCOUNTER — Encounter (HOSPITAL_COMMUNITY): Payer: Self-pay | Admitting: *Deleted

## 2014-10-12 ENCOUNTER — Inpatient Hospital Stay (HOSPITAL_COMMUNITY): Payer: Medicaid Other | Admitting: Anesthesiology

## 2014-10-12 ENCOUNTER — Inpatient Hospital Stay (HOSPITAL_COMMUNITY)
Admission: AD | Admit: 2014-10-12 | Discharge: 2014-10-14 | DRG: 775 | Disposition: A | Discharge status: Home / Self Care | Payer: Medicaid Other | Source: Ambulatory Visit | Attending: Obstetrics and Gynecology | Admitting: Obstetrics and Gynecology

## 2014-10-12 DIAGNOSIS — Z825 Family history of asthma and other chronic lower respiratory diseases: Secondary | ICD-10-CM | POA: Diagnosis not present

## 2014-10-12 DIAGNOSIS — O99824 Streptococcus B carrier state complicating childbirth: Secondary | ICD-10-CM | POA: Diagnosis present

## 2014-10-12 DIAGNOSIS — O0930 Supervision of pregnancy with insufficient antenatal care, unspecified trimester: Secondary | ICD-10-CM | POA: Diagnosis not present

## 2014-10-12 DIAGNOSIS — O43193 Other malformation of placenta, third trimester: Secondary | ICD-10-CM | POA: Diagnosis present

## 2014-10-12 DIAGNOSIS — O9952 Diseases of the respiratory system complicating childbirth: Secondary | ICD-10-CM | POA: Diagnosis present

## 2014-10-12 DIAGNOSIS — R51 Headache: Secondary | ICD-10-CM | POA: Diagnosis present

## 2014-10-12 DIAGNOSIS — M8560 Other cyst of bone, unspecified site: Secondary | ICD-10-CM | POA: Diagnosis present

## 2014-10-12 DIAGNOSIS — J45909 Unspecified asthma, uncomplicated: Secondary | ICD-10-CM | POA: Diagnosis present

## 2014-10-12 DIAGNOSIS — R519 Headache, unspecified: Secondary | ICD-10-CM | POA: Diagnosis not present

## 2014-10-12 DIAGNOSIS — Z3A39 39 weeks gestation of pregnancy: Secondary | ICD-10-CM | POA: Diagnosis present

## 2014-10-12 DIAGNOSIS — M8500 Fibrous dysplasia (monostotic), unspecified site: Secondary | ICD-10-CM | POA: Diagnosis present

## 2014-10-12 DIAGNOSIS — B951 Streptococcus, group B, as the cause of diseases classified elsewhere: Secondary | ICD-10-CM

## 2014-10-12 DIAGNOSIS — IMO0002 Reserved for concepts with insufficient information to code with codable children: Secondary | ICD-10-CM | POA: Diagnosis present

## 2014-10-12 LAB — CBC
HCT: 39.9 % (ref 36.0–46.0)
HEMOGLOBIN: 13.2 g/dL (ref 12.0–15.0)
MCH: 28.2 pg (ref 26.0–34.0)
MCHC: 33.1 g/dL (ref 30.0–36.0)
MCV: 85.3 fL (ref 78.0–100.0)
Platelets: 225 10*3/uL (ref 150–400)
RBC: 4.68 MIL/uL (ref 3.87–5.11)
RDW: 13.7 % (ref 11.5–15.5)
WBC: 12.7 10*3/uL — AB (ref 4.0–10.5)

## 2014-10-12 LAB — COMPREHENSIVE METABOLIC PANEL
ALT: 12 U/L — AB (ref 14–54)
AST: 25 U/L (ref 15–41)
Albumin: 3.2 g/dL — ABNORMAL LOW (ref 3.5–5.0)
Alkaline Phosphatase: 180 U/L — ABNORMAL HIGH (ref 38–126)
Anion gap: 10 (ref 5–15)
BUN: 6 mg/dL (ref 6–20)
CHLORIDE: 105 mmol/L (ref 101–111)
CO2: 20 mmol/L — AB (ref 22–32)
CREATININE: 0.51 mg/dL (ref 0.44–1.00)
Calcium: 9.1 mg/dL (ref 8.9–10.3)
GFR calc non Af Amer: >60 mL/min (ref >60)
Glucose, Bld: 68 mg/dL (ref 65–99)
POTASSIUM: 3.8 mmol/L (ref 3.5–5.1)
SODIUM: 135 mmol/L (ref 135–145)
Total Bilirubin: 0.7 mg/dL (ref 0.3–1.2)
Total Protein: 6.8 g/dL (ref 6.5–8.1)

## 2014-10-12 LAB — TYPE AND SCREEN
ABO/RH(D): A POS
Antibody Screen: NEGATIVE

## 2014-10-12 LAB — HIV ANTIBODY (ROUTINE TESTING W REFLEX): HIV SCREEN 4TH GENERATION: NONREACTIVE

## 2014-10-12 LAB — RPR: RPR: NONREACTIVE

## 2014-10-12 LAB — PROTEIN / CREATININE RATIO, URINE
CREATININE, URINE: 152 mg/dL
Protein Creatinine Ratio: 0.12 mg/mg{Cre} (ref 0.00–0.15)
TOTAL PROTEIN, URINE: 18 mg/dL

## 2014-10-12 LAB — LACTATE DEHYDROGENASE: LDH: 156 U/L (ref 98–192)

## 2014-10-12 LAB — URIC ACID: URIC ACID, SERUM: 4.3 mg/dL (ref 2.3–6.6)

## 2014-10-12 LAB — ABO/RH: ABO/RH(D): A POS

## 2014-10-12 MED ORDER — PRENATAL MULTIVITAMIN CH
1.0000 | ORAL_TABLET | Freq: Every day | ORAL | Status: DC
  Administered 2014-10-13: 1 via ORAL
  Filled 2014-10-12: qty 1

## 2014-10-12 MED ORDER — ACETAMINOPHEN 325 MG PO TABS: 650.0000 mg | ORAL_TABLET | ORAL | Status: DC | PRN

## 2014-10-12 MED ORDER — WITCH HAZEL-GLYCERIN EX PADS: 1.0000 "application " | MEDICATED_PAD | CUTANEOUS | Status: DC | PRN

## 2014-10-12 MED ORDER — EPHEDRINE 5 MG/ML INJ
10.0000 mg | INTRAVENOUS | Status: DC | PRN
Start: 2014-10-12 — End: 2014-10-12

## 2014-10-12 MED ORDER — ONDANSETRON HCL 4 MG PO TABS: 4.0000 mg | ORAL_TABLET | ORAL | Status: DC | PRN

## 2014-10-12 MED ORDER — ZOLPIDEM TARTRATE 5 MG PO TABS
5.0000 mg | ORAL_TABLET | Freq: Every evening | ORAL | Status: DC | PRN
Start: 2014-10-12 — End: 2014-10-14

## 2014-10-12 MED ORDER — DIPHENHYDRAMINE HCL 50 MG/ML IJ SOLN: 12.5000 mg | INTRAMUSCULAR | Status: DC | PRN

## 2014-10-12 MED ORDER — TETANUS-DIPHTH-ACELL PERTUSSIS 5-2.5-18.5 LF-MCG/0.5 IM SUSP: 0.5000 mL | Freq: Once | INTRAMUSCULAR | Status: DC

## 2014-10-12 MED ORDER — CITRIC ACID-SODIUM CITRATE 334-500 MG/5ML PO SOLN: 30.0000 mL | ORAL | Status: DC | PRN

## 2014-10-12 MED ORDER — DIBUCAINE 1 % RE OINT: 1.0000 "application " | TOPICAL_OINTMENT | RECTAL | Status: DC | PRN

## 2014-10-12 MED ORDER — IBUPROFEN 600 MG PO TABS
600.0000 mg | ORAL_TABLET | Freq: Four times a day (QID) | ORAL | Status: DC
  Administered 2014-10-12 – 2014-10-14 (×7): 600 mg via ORAL
  Filled 2014-10-12 (×7): qty 1

## 2014-10-12 MED ORDER — ONDANSETRON HCL 4 MG/2ML IJ SOLN: 4.0000 mg | Freq: Four times a day (QID) | INTRAMUSCULAR | Status: DC | PRN

## 2014-10-12 MED ORDER — LACTATED RINGERS IV SOLN
500.0000 mL | INTRAVENOUS | Status: DC | PRN
  Administered 2014-10-12: 500 mL via INTRAVENOUS

## 2014-10-12 MED ORDER — PENICILLIN G POTASSIUM 5000000 UNITS IJ SOLR
2.5000 10*6.[IU] | INTRAVENOUS | Status: DC
  Administered 2014-10-12 (×2): 2.5 10*6.[IU] via INTRAVENOUS
  Filled 2014-10-12 (×5): qty 2.5

## 2014-10-12 MED ORDER — PENICILLIN G POTASSIUM 5000000 UNITS IJ SOLR
5.0000 10*6.[IU] | Freq: Once | INTRAVENOUS | Status: AC
  Administered 2014-10-12: 5 10*6.[IU] via INTRAVENOUS
  Filled 2014-10-12: qty 5

## 2014-10-12 MED ORDER — ONDANSETRON HCL 4 MG/2ML IJ SOLN: 4.0000 mg | INTRAMUSCULAR | Status: DC | PRN

## 2014-10-12 MED ORDER — LANOLIN HYDROUS EX OINT: TOPICAL_OINTMENT | CUTANEOUS | Status: DC | PRN

## 2014-10-12 MED ORDER — PHENYLEPHRINE 40 MCG/ML (10ML) SYRINGE FOR IV PUSH (FOR BLOOD PRESSURE SUPPORT)
80.0000 ug | PREFILLED_SYRINGE | INTRAVENOUS | Status: DC | PRN
  Filled 2014-10-12: qty 20

## 2014-10-12 MED ORDER — SENNOSIDES-DOCUSATE SODIUM 8.6-50 MG PO TABS
2.0000 | ORAL_TABLET | ORAL | Status: DC
  Administered 2014-10-13 – 2014-10-14 (×2): 2 via ORAL
  Filled 2014-10-12 (×2): qty 2

## 2014-10-12 MED ORDER — BENZOCAINE-MENTHOL 20-0.5 % EX AERO
1.0000 "application " | INHALATION_SPRAY | CUTANEOUS | Status: DC | PRN
  Administered 2014-10-13: 1 via TOPICAL
  Filled 2014-10-12: qty 56

## 2014-10-12 MED ORDER — PNEUMOCOCCAL VAC POLYVALENT 25 MCG/0.5ML IJ INJ
0.5000 mL | INJECTION | INTRAMUSCULAR | Status: AC
Start: 2014-10-13 — End: 2014-10-13
  Administered 2014-10-13: 0.5 mL via INTRAMUSCULAR
  Filled 2014-10-12: qty 0.5

## 2014-10-12 MED ORDER — FLEET ENEMA 7-19 GM/118ML RE ENEM: 1.0000 | ENEMA | RECTAL | Status: DC | PRN

## 2014-10-12 MED ORDER — INFLUENZA VAC SPLIT QUAD 0.5 ML IM SUSY
0.5000 mL | PREFILLED_SYRINGE | INTRAMUSCULAR | Status: AC
  Administered 2014-10-13: 0.5 mL via INTRAMUSCULAR
  Filled 2014-10-12: qty 0.5

## 2014-10-12 MED ORDER — OXYCODONE-ACETAMINOPHEN 5-325 MG PO TABS: 2.0000 | ORAL_TABLET | ORAL | Status: DC | PRN

## 2014-10-12 MED ORDER — IPRATROPIUM BROMIDE 0.02 % IN SOLN
0.5000 mg | RESPIRATORY_TRACT | Status: DC | PRN
  Administered 2014-10-12: 0.5 mg via RESPIRATORY_TRACT
  Filled 2014-10-12 (×2): qty 2.5

## 2014-10-12 MED ORDER — OXYTOCIN 40 UNITS IN LACTATED RINGERS INFUSION - SIMPLE MED
62.5000 mL/h | INTRAVENOUS | Status: DC
  Filled 2014-10-12: qty 1000

## 2014-10-12 MED ORDER — FENTANYL 2.5 MCG/ML BUPIVACAINE 1/10 % EPIDURAL INFUSION (WH - ANES)
14.0000 mL/h | INTRAMUSCULAR | Status: DC | PRN
  Administered 2014-10-12 (×2): 14 mL/h via EPIDURAL
  Filled 2014-10-12 (×2): qty 125

## 2014-10-12 MED ORDER — LIDOCAINE HCL (PF) 1 % IJ SOLN
30.0000 mL | INTRAMUSCULAR | Status: DC | PRN
  Administered 2014-10-12: 30 mL via SUBCUTANEOUS
  Filled 2014-10-12: qty 30

## 2014-10-12 MED ORDER — SIMETHICONE 80 MG PO CHEW: 80.0000 mg | CHEWABLE_TABLET | ORAL | Status: DC | PRN

## 2014-10-12 MED ORDER — LIDOCAINE HCL (PF) 1 % IJ SOLN
INTRAMUSCULAR | Status: DC | PRN
  Administered 2014-10-12 (×2): 4 mL

## 2014-10-12 MED ORDER — FENTANYL CITRATE (PF) 100 MCG/2ML IJ SOLN
100.0000 ug | INTRAMUSCULAR | Status: DC | PRN
  Administered 2014-10-12 (×2): 100 ug via INTRAVENOUS
  Filled 2014-10-12 (×2): qty 2

## 2014-10-12 MED ORDER — OXYCODONE-ACETAMINOPHEN 5-325 MG PO TABS: 1.0000 | ORAL_TABLET | ORAL | Status: DC | PRN

## 2014-10-12 MED ORDER — OXYTOCIN BOLUS FROM INFUSION
500.0000 mL | INTRAVENOUS | Status: DC
  Administered 2014-10-12: 500 mL via INTRAVENOUS

## 2014-10-12 MED ORDER — DIPHENHYDRAMINE HCL 25 MG PO CAPS: 25.0000 mg | ORAL_CAPSULE | Freq: Four times a day (QID) | ORAL | Status: DC | PRN

## 2014-10-12 MED ORDER — ALBUTEROL SULFATE (2.5 MG/3ML) 0.083% IN NEBU
2.5000 mg | INHALATION_SOLUTION | RESPIRATORY_TRACT | Status: DC | PRN
  Administered 2014-10-12: 2.5 mg via RESPIRATORY_TRACT
  Filled 2014-10-12: qty 3

## 2014-10-12 MED ORDER — LACTATED RINGERS IV SOLN
INTRAVENOUS | Status: DC
  Administered 2014-10-12 (×2): via INTRAVENOUS

## 2014-10-12 NOTE — Progress Notes (Addendum)
Subjective: Just received epidural--getting more comfortable, still having some pain in right side.  Objective: BP 131/80 mmHg  Pulse 81  Temp(Src) 98.6 F (37 C) (Oral)  Resp 18  Ht 5\' 6"  (1.676 m)  Wt 80.74 kg (178 lb)  BMI 28.74 kg/m2  SpO2 100%  LMP 01/19/2014    Received 1st dose PCN at 0857  FHT: Category 1 UC:   regular, every 3-4 minutes SVE:   Dilation: 7 Effacement (%): 100 Station: 0 Exam by:: Cira Servant, CNM  Leaking clear fluid  Results for orders placed or performed during the hospital encounter of 10/12/14 (from the past 24 hour(s))  Type and screen     Status: None   Collection Time: 10/12/14  7:50 AM  Result Value Ref Range   ABO/RH(D) A POS    Antibody Screen NEG    Sample Expiration 10/15/2014   RPR     Status: None   Collection Time: 10/12/14  7:50 AM  Result Value Ref Range   RPR Ser Ql Non Reactive Non Reactive  Comprehensive metabolic panel     Status: Abnormal   Collection Time: 10/12/14  7:50 AM  Result Value Ref Range   Sodium 135 135 - 145 mmol/L   Potassium 3.8 3.5 - 5.1 mmol/L   Chloride 105 101 - 111 mmol/L   CO2 20 (L) 22 - 32 mmol/L   Glucose, Bld 68 65 - 99 mg/dL   BUN 6 6 - 20 mg/dL   Creatinine, Ser 0.51 0.44 - 1.00 mg/dL   Calcium 9.1 8.9 - 10.3 mg/dL   Total Protein 6.8 6.5 - 8.1 g/dL   Albumin 3.2 (L) 3.5 - 5.0 g/dL   AST 25 15 - 41 U/L   ALT 12 (L) 14 - 54 U/L   Alkaline Phosphatase 180 (H) 38 - 126 U/L   Total Bilirubin 0.7 0.3 - 1.2 mg/dL   GFR calc non Af Amer >60 >60 mL/min   GFR calc Af Amer >60 >60 mL/min   Anion gap 10 5 - 15  Lactate dehydrogenase     Status: None   Collection Time: 10/12/14  7:50 AM  Result Value Ref Range   LDH 156 98 - 192 U/L  Uric acid     Status: None   Collection Time: 10/12/14  7:50 AM  Result Value Ref Range   Uric Acid, Serum 4.3 2.3 - 6.6 mg/dL  ABO/Rh     Status: None   Collection Time: 10/12/14  7:50 AM  Result Value Ref Range   ABO/RH(D) A POS   HIV antibody (routine  testing) (NOT for [facility])     Status: None   Collection Time: 10/12/14  7:57 AM  Result Value Ref Range   HIV Screen 4th Generation wRfx Non Reactive Non Reactive  CBC     Status: Abnormal   Collection Time: 10/12/14  8:02 AM  Result Value Ref Range   WBC 12.7 (H) 4.0 - 10.5 K/uL   RBC 4.68 3.87 - 5.11 MIL/uL   Hemoglobin 13.2 12.0 - 15.0 g/dL   HCT 39.9 36.0 - 46.0 %   MCV 85.3 78.0 - 100.0 fL   MCH 28.2 26.0 - 34.0 pg   MCHC 33.1 30.0 - 36.0 g/dL   RDW 13.7 11.5 - 15.5 %   Platelets 225 150 - 400 K/uL  Protein / creatinine ratio, urine     Status: None   Collection Time: 10/12/14  9:45 AM  Result Value Ref Range  Creatinine, Urine 152.00 mg/dL   Total Protein, Urine 18 mg/dL   Protein Creatinine Ratio 0.12 0.00 - 0.15 mg/mg[Cre]     Assessment:  Active labor GBS positive  Plan: Continue current care.  Donnel Saxon CNM 10/12/2014, 12:20 PM

## 2014-10-12 NOTE — Anesthesia Preprocedure Evaluation (Addendum)
Anesthesia Evaluation  Patient identified by MRN, date of birth, ID band Patient awake    Reviewed: Allergy & Precautions, NPO status , Patient's Chart, lab work & pertinent test results  Airway Mallampati: II  TM Distance: >3 FB Neck ROM: Full    Dental no notable dental hx. (+) Dental Advisory Given   Pulmonary asthma ,    Pulmonary exam normal breath sounds clear to auscultation       Cardiovascular negative cardio ROS Normal cardiovascular exam Rhythm:Regular Rate:Normal     Neuro/Psych  Headaches, negative psych ROS   GI/Hepatic negative GI ROS, Neg liver ROS,   Endo/Other  negative endocrine ROS  Renal/GU negative Renal ROS  negative genitourinary   Musculoskeletal negative musculoskeletal ROS (+)   Abdominal   Peds negative pediatric ROS (+)  Hematology negative hematology ROS (+)   Anesthesia Other Findings   Reproductive/Obstetrics (+) Pregnancy                            Anesthesia Physical Anesthesia Plan  ASA: II  Anesthesia Plan: Epidural   Post-op Pain Management:    Induction:   Airway Management Planned:   Additional Equipment:   Intra-op Plan:   Post-operative Plan:   Informed Consent: I have reviewed the patients History and Physical, chart, labs and discussed the procedure including the risks, benefits and alternatives for the proposed anesthesia with the patient or authorized representative who has indicated his/her understanding and acceptance.     Plan Discussed with: CRNA  Anesthesia Plan Comments:         Anesthesia Quick Evaluation

## 2014-10-12 NOTE — Progress Notes (Signed)
  Subjective: Comfortable, but feeling "chest is tight, like when my asthma acts up".  Denies chest pain, no cough.  No recent use of inhaler.  Objective: BP 115/83 mmHg  Pulse 95  Temp(Src) 97.8 F (36.6 C) (Oral)  Resp 18  Ht 5\' 6"  (1.676 m)  Wt 80.74 kg (178 lb)  BMI 28.74 kg/m2  SpO2 100%  LMP 01/19/2014      Filed Vitals:   10/12/14 1431 10/12/14 1456 10/12/14 1501 10/12/14 1515  BP: 120/67  115/83   Pulse: 85 84 95 96  Temp:      TempSrc:      Resp: 18  18   Height:      Weight:      SpO2:  100% 100% 100%   In NAD Chest clear Heart RRR without murmur FHT: Category 1 UC:   regular, every 3-4 minutes SVE:   Dilation: 7 Effacement (%): 100 Station: 0 Exam by:: Cira Servant, CNM at 12:20p   Assessment:  Active labor Hx asthma  Plan: Nebulizer treatment now. If no benefit, may be related to epidural level.  Will consult with Anesthesia as needed. Recheck cervix at 4pm Augmentation prn.  Donnel Saxon CNM 10/12/2014, 3:10 PM

## 2014-10-12 NOTE — Progress Notes (Signed)
RT at bedside to administer breathing treatment to patient.

## 2014-10-12 NOTE — Progress Notes (Signed)
RT called and requested to perform nebulizer treatment for patient.

## 2014-10-12 NOTE — Progress Notes (Signed)
Patient states, "I'm having trouble breathing and it feels like a weight on my chest."  02 saturation 100%.  Cira Servant, CNM notified and at bedside to assess patient.

## 2014-10-12 NOTE — H&P (Signed)
Maria Combs is a 24 y.o. female, G1P0 at 36 3/7 weeks, presenting for UCs of increasing intensity and frequency, now q 3-5 min.  Leaking since 0630, clear fluid.  Contracted all night, no sleep.  Cervix was closed on office visit 10/05/14.  Patient Active Problem List   Diagnosis Date Noted  . 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 Marginal insertion of umbilical cord 37/62/8315  . Late prenatal care--17 weeks 10/12/2014  . Asthma   . Syncope 06/07/2011    History of present pregnancy: Patient entered care at 17 3/7 weeks.   EDC of 10/16/14 was established by [place] at 11 1/7 weeks, due to uncertain LMP.   Anatomy scan:  19 2/7 weeks, EFW 65%ile, with normal anatomy findings and an posterior placenta, with marginal cord insertion.   Additional [place] evaluations:  . 24 3/7 weeks at [facility]:  EFW 59%ile, normal fluid, marginal insertion, cervix 3.64 28 2/7 weeks:  EFW 72%ile, normal fluid, marginal insertion, cervix closed 32 2/7 weeks:  EFW 54%ile, normal fluid, cord insertion not visualized, vtx. Significant prenatal events:  Entered care at 17 weeks.  Hx of fibrous dysplasia of skull, with cosmetic surgery as teenager.  Growth followed due to marginal insertion, WNL.  GBS positive.  TDAP 07/22/14. Last evaluation:  10/05/14--Cervix closed, long, vtx, -3, BP 104/70.  OB History    Gravida Para Term Preterm AB TAB SAB Ectopic Multiple Living   1              Past Medical History  Diagnosis Date  . Brain tumor (benign)   . Asthma   . Fibrous dysplasia of bone   Normal CT of head 2007  Past Surgical History  Procedure Laterality Date  . Cranial surgery     Family History: family history includes Asthma in her sister.   Social History:  reports that she has never smoked. She does not have any smokeless tobacco history on file. She reports that she does not drink alcohol or use illicit drugs. Patient is Native American, single, college-educated, with FOB  involved and supportive.   Prenatal Transfer Tool  Maternal Diabetes: No Genetic Screening: Normal Quad screen Maternal Ultrasounds/Referrals: Normal Fetal Ultrasounds or other Referrals:  None Maternal Substance Abuse:  No Significant Maternal Medications:  None Significant Maternal Lab Results: Lab values include: Group B Strep positive  TDAP 07/22/14 Flu NA  ROS:  Contractions, leaking clear fluid, +FM  No Known Allergies   Dilation: 1 Effacement (%): 80 Station: -1 Exam by:: V Latham CNM Blood pressure 120/89, pulse 83, temperature 97.8 F (36.6 C), temperature source Oral, resp. rate 18, last menstrual period 01/19/2014.  Chest clear Heart RRR without murmur Abd gravid, NT, FH  Pelvic: Cervix posterior, leaking small amount clear fluid Ext: DTR 2+, no clonus, trace edema  FHR: Category 1 UCs:  q 3-5 min, moderate  Prenatal labs: ABO, Rh:  A+ Antibody:  Neg Rubella:   Immune RPR:   NR HBsAg:   Neg HIV:   NR GBS:  Positive 09/21/14 Sickle cell/Hgb electrophoresis:  AA Pap:  WNL 04/2014 GC:  Negative 04/30/14, 09/21/14 Chlamydia:  Negative 04/30/14, 09/21/14 Genetic screenings:  Normal Quad screen Glucola:  WNL Other:   Hgb 12.4 at NOB, 11.7 at 28 weeks     Assessment/Plan: IUP at 39 3/7 weeks Early labor SROM 0630 GBS positive Marginal cord insertion  Plan: Admit to [place] per consult with Dr. Charlesetta Garibaldi Routine [facility] orders Pain  med/epidural prn PCN G for GBS prophylaxis  Augmentation prn  LATHAM, VICKICNM, [place] 10/12/2014, 7:59 AM

## 2014-10-12 NOTE — Progress Notes (Signed)
Patient reports that shortness of breath has resolved.

## 2014-10-12 NOTE — Progress Notes (Signed)
  Subjective: Comfortable, previous SOB resolved.  Received nebulizer treatment and threw up, with resolution of sensation after that, no recurrence.  No rectal pressure.  Objective: BP 127/85 mmHg  Pulse 106  Temp(Src) 98.8 F (37.1 C) (Oral)  Resp 18  Ht 5\' 6"  (1.676 m)  Wt 80.74 kg (178 lb)  BMI 28.74 kg/m2  SpO2 100%  LMP 01/19/2014      FHT: Category 1 UC:   regular, every 3-4 minutes SVE:   Dilation: 10 Effacement (%): 100 Station: +2 Exam by:: Cira Servant, CNM   Assessment:  2nd stage labor--no urge to push.  Plan: Position to facilitate descent and sensation of pressure. Await urge to push. Dr. Charlesetta Garibaldi updated.  Donnel Saxon CNM 10/12/2014, 4:25 PM

## 2014-10-12 NOTE — Anesthesia Procedure Notes (Signed)
Epidural Patient location during procedure: OB  Staffing Anesthesiologist: JUDD, MARY Performed by: anesthesiologist   Preanesthetic Checklist Completed: patient identified, site marked, surgical consent, pre-op evaluation, timeout performed, IV checked, risks and benefits discussed and monitors and equipment checked  Epidural Patient position: sitting Prep: site prepped and draped and DuraPrep Patient monitoring: continuous pulse ox and blood pressure Approach: midline Location: L3-L4 Injection technique: LOR saline  Needle:  Needle type: Tuohy  Needle gauge: 17 G Needle length: 9 cm and 9 Needle insertion depth: 5 cm cm Catheter type: closed end flexible Catheter size: 19 Gauge Catheter at skin depth: 10 cm Test dose: negative  Assessment Events: blood not aspirated, injection not painful, no injection resistance, negative IV test and no paresthesia  Additional Notes Patient identified. Risks/Benefits/Options discussed with patient including but not limited to bleeding, infection, nerve damage, paralysis, failed block, incomplete pain control, headache, blood pressure changes, nausea, vomiting, reactions to medication both or allergic, itching and postpartum back pain. Confirmed with bedside nurse the patient's most recent platelet count. Confirmed with patient that they are not currently taking any anticoagulation, have any bleeding history or any family history of bleeding disorders. Patient expressed understanding and wished to proceed. All questions were answered. Sterile technique was used throughout the entire procedure. Please see nursing notes for vital signs. Test dose was given through epidural catheter and negative prior to continuing to dose epidural or start infusion. Warning signs of high block given to the patient including shortness of breath, tingling/numbness in hands, complete motor block, or any concerning symptoms with instructions to call for help. Patient was  given instructions on fall risk and not to get out of bed. All questions and concerns addressed with instructions to call with any issues or inadequate analgesia.      

## 2014-10-12 NOTE — MAU Note (Signed)
C/o ucs since 2350 last night; ?SROM @ 0600 this AM;

## 2014-10-13 LAB — CBC
HEMATOCRIT: 35.3 % — AB (ref 36.0–46.0)
Hemoglobin: 11.6 g/dL — ABNORMAL LOW (ref 12.0–15.0)
MCH: 28.4 pg (ref 26.0–34.0)
MCHC: 32.9 g/dL (ref 30.0–36.0)
MCV: 86.3 fL (ref 78.0–100.0)
PLATELETS: 203 10*3/uL (ref 150–400)
RBC: 4.09 MIL/uL (ref 3.87–5.11)
RDW: 13.9 % (ref 11.5–15.5)
WBC: 19 10*3/uL — AB (ref 4.0–10.5)

## 2014-10-13 NOTE — Anesthesia Postprocedure Evaluation (Signed)
Anesthesia Post Note  Patient: Maria Combs  Procedure(s) Performed: * No procedures listed *  Anesthesia type: Epidural  Patient location: Mother/Baby  Post pain: Pain level controlled  Post assessment: Post-op Vital signs reviewed  Last Vitals:  Filed Vitals:   10/13/14 0538  BP: 114/65  Pulse: 89  Temp: 36.8 C  Resp: 18    Post vital signs: Reviewed  Level of consciousness: awake  Complications: No apparent anesthesia complications

## 2014-10-13 NOTE — Progress Notes (Signed)
Maria Combs   Subjective: Post Partum Day 1 Vaginal delivery, Bilateral periurethral; right inner labia extending along right vaginal wall Patient up ad lib, denies syncope or dizziness. Reports consuming regular diet without issues and denies N/V No issues with urination and reports bleeding is appropriate  Feeding:  Breast Contraceptive plan:   unsure  Objective: Temp:  [97.8 F (36.6 C)-98.8 F (37.1 C)] 98.7 F (37.1 C) (09/28 0830) Pulse Rate:  [70-112] 85 (09/28 0830) Resp:  [18-20] 20 (09/28 0830) BP: (81-134)/(49-86) 110/66 mmHg (09/28 0830) SpO2:  [100 %] 100 % (09/28 0045)  Physical Exam:  General: alert and cooperative Ext: WNL, no significant  edema. No evidence of DVT seen on physical exam. Breast: Soft filling Lungs: CTAB Heart RRR without murmur  Abdomen:  Soft, fundus firm, lochia scant, + bowel sounds, non distended, non tender Lochia: appropriate Uterine Fundus: firm Laceration: healing well    Recent Labs  10/12/14 0802 10/13/14 0650  HGB 13.2 11.6*  HCT 39.9 35.3*    Assessment S/P Vaginal Delivery-Day 1 Stable  Normal Involution Breastfeeding   Plan: Continue current care Plan for discharge tomorrow Lactation support   Venus Standard, CNM, MSN 10/13/2014, 1:06 PM

## 2014-10-14 MED ORDER — IBUPROFEN 600 MG PO TABS: 600.0000 mg | ORAL_TABLET | Freq: Four times a day (QID) | ORAL | Status: DC | PRN

## 2014-10-14 NOTE — Discharge Instructions (Signed)
Postpartum Care After Vaginal Delivery °After you deliver your newborn (postpartum period), the usual stay in the hospital is 24-72 hours. If there were problems with your labor or delivery, or if you have other medical problems, you might be in the hospital longer.  °While you are in the hospital, you will receive help and instructions on how to care for yourself and your newborn during the postpartum period.  °While you are in the hospital: °· Be sure to tell your nurses if you have pain or discomfort, as well as where you feel the pain and what makes the pain worse. °· If you had an incision made near your vagina (episiotomy) or if you had some tearing during delivery, the nurses may put ice packs on your episiotomy or tear. The ice packs may help to reduce the pain and swelling. °· If you are breastfeeding, you may feel uncomfortable contractions of your uterus for a couple of weeks. This is normal. The contractions help your uterus get back to normal size. °· It is normal to have some bleeding after delivery. °· For the first 1-3 days after delivery, the flow is red and the amount may be similar to a period. °· It is common for the flow to start and stop. °· In the first few days, you may pass some small clots. Let your nurses know if you begin to pass large clots or your flow increases. °· Do not  flush blood clots down the toilet before having the nurse look at them. °· During the next 3-10 days after delivery, your flow should become more watery and pink or brown-tinged in color. °· Ten to fourteen days after delivery, your flow should be a small amount of yellowish-white discharge. °· The amount of your flow will decrease over the first few weeks after delivery. Your flow may stop in 6-8 weeks. Most women have had their flow stop by 12 weeks after delivery. °· You should change your sanitary pads frequently. °· Wash your hands thoroughly with soap and water for at least 20 seconds after changing pads, using  the toilet, or before holding or feeding your newborn. °· You should feel like you need to empty your bladder within the first 6-8 hours after delivery. °· In case you become weak, lightheaded, or faint, call your nurse before you get out of bed for the first time and before you take a shower for the first time. °· Within the first few days after delivery, your breasts may begin to feel tender and full. This is called engorgement. Breast tenderness usually goes away within 48-72 hours after engorgement occurs. You may also notice milk leaking from your breasts. If you are not breastfeeding, do not stimulate your breasts. Breast stimulation can make your breasts produce more milk. °· Spending as much time as possible with your newborn is very important. During this time, you and your newborn can feel close and get to know each other. Having your newborn stay in your room (rooming in) will help to strengthen the bond with your newborn.  It will give you time to get to know your newborn and become comfortable caring for your newborn. °· Your hormones change after delivery. Sometimes the hormone changes can temporarily cause you to feel sad or tearful. These feelings should not last more than a few days. If these feelings last longer than that, you should talk to your caregiver. °· If desired, talk to your caregiver about methods of family planning or contraception. °·   Talk to your caregiver about immunizations. Your caregiver may want you to have the following immunizations before leaving the hospital:  Tetanus, diphtheria, and pertussis (Tdap) or tetanus and diphtheria (Td) immunization. It is very important that you and your family (including grandparents) or others caring for your newborn are up-to-date with the Tdap or Td immunizations. The Tdap or Td immunization can help protect your newborn from getting ill.  Rubella immunization.  Varicella (chickenpox) immunization.  Influenza immunization. You should  receive this annual immunization if you did not receive the immunization during your pregnancy. Document Released: 10/29/2006 Document Revised: 09/26/2011 Document Reviewed: 08/29/2011 [facility] Patient Information 2015 [place], [place]. This information is not intended to replace advice given to you by your health care provider. Make sure you discuss any questions you have with your health care provider.  Contraception Choices For Breastfeeding Mothers Contraception (birth control) is the use of any methods or devices to prevent pregnancy. Below are some methods to help avoid pregnancy. HORMONAL METHODS   Contraceptive implant. This is a thin, plastic tube containing progesterone hormone. It does not contain estrogen hormone. Your health care provider inserts the tube in the inner part of the upper arm. The tube can remain in place for up to 3 years. After 3 years, the implant must be removed. The implant prevents the ovaries from releasing an egg (ovulation), thickens the cervical mucus to prevent sperm from entering the uterus, and thins the lining of the inside of the uterus.  Progesterone-only injections. These injections are given every 3 months by your health care provider to prevent pregnancy. This synthetic progesterone hormone stops the ovaries from releasing eggs. It also thickens cervical mucus and changes the uterine lining. This makes it harder for sperm to survive in the uterus.  Minipill. This type of birth control pill contains only the progesterone hormone. They are taken every day of each month and must be prescribed by your health care provider.  Emergency contraception. Emergency contraceptives prevent pregnancy after unprotected sexual intercourse. This pill can be taken right after sex or up to 5 days after unprotected sex. It is most effective the sooner you take the pills after having sexual intercourse. Most emergency contraceptive pills are available without a prescription.  Check with your pharmacist. Do not use emergency contraception as your only form of birth control. BARRIER METHODS   Female condom. This is a thin sheath (latex or rubber) that is worn over the penis during sexual intercourse. It can be used with spermicide to increase effectiveness.  Female condom. This is a soft, loose-fitting sheath that is put into the vagina before sexual intercourse.  Diaphragm. This is a soft, latex, dome-shaped barrier that must be fitted by a health care provider. It is inserted into the vagina, along with a spermicidal jelly. It is inserted before intercourse. The diaphragm should be left in the vagina for 6 to 8 hours after intercourse.  Cervical cap. This is a round, soft, latex or plastic cup that fits over the cervix and must be fitted by a health care provider. The cap can be left in place for up to 48 hours after intercourse.  Sponge. This is a soft, circular piece of polyurethane foam. The sponge has spermicide in it. It is inserted into the vagina after wetting it and before sexual intercourse.  Spermicides. These are chemicals that kill or block sperm from entering the cervix and uterus. They come in the form of creams, jellies, suppositories, foam, or tablets. They do  not require a prescription. They are inserted into the vagina with an applicator before having sexual intercourse. The process must be repeated every time you have sexual intercourse. INTRAUTERINE CONTRACEPTION  Intrauterine device (IUD). This is a T-shaped device that is put in a woman's uterus during a menstrual period to prevent pregnancy. There are 2 types:  Copper IUD. This type of IUD is wrapped in copper wire and is placed inside the uterus. Copper makes the uterus and fallopian tubes produce a fluid that kills sperm. It can stay in place for 10 years.  Hormone IUD. This type of IUD contains the hormone progestin (synthetic progesterone). The hormone thickens the cervical mucus and prevents  sperm from entering the uterus, and it also thins the uterine lining to prevent implantation of a fertilized egg. The hormone can weaken or kill the sperm that get into the uterus. It can stay in place for 3-5 years, depending on which type of IUD is used. PERMANENT METHODS OF CONTRACEPTION  Female tubal ligation. This is when the woman's fallopian tubes are surgically sealed, tied, or blocked to prevent the egg from traveling to the uterus.  Hysteroscopic sterilization. This involves placing a small coil or insert into each fallopian tube. Your doctor uses a technique called hysteroscopy to do the procedure. The device causes scar tissue to form. This results in permanent blockage of the fallopian tubes, so the sperm cannot fertilize the egg. It takes about 3 months after the procedure for the tubes to become blocked. You must use another form of birth control for these 3 months.  Female sterilization. This is when the female has the tubes that carry sperm tied off (vasectomy).This blocks sperm from entering the vagina during sexual intercourse. After the procedure, the man can still ejaculate fluid (semen). NATURAL PLANNING METHODS  Natural family planning. This is not having sexual intercourse or using a barrier method (condom, diaphragm, cervical cap) on days the woman could become pregnant.  Calendar method. This is keeping track of the length of each menstrual cycle and identifying when you are fertile.  Ovulation method. This is avoiding sexual intercourse during ovulation.  Symptothermal method. This is avoiding sexual intercourse during ovulation, using a thermometer and ovulation symptoms.  Post-ovulation method. This is timing sexual intercourse after you have ovulated. Regardless of which type or method of contraception you choose, it is important that you use condoms to protect against the transmission of sexually transmitted infections (STIs). Talk with your health care provider about  which form of contraception is most appropriate for you. Document Released: 01/01/2005 Document Revised: 01/06/2013 Document Reviewed: 06/26/2012 [facility] Patient Information 2015 [place], [place]. This information is not intended to replace advice given to you by your health care provider. Make sure you discuss any questions you have with your health care provider.

## 2014-10-14 NOTE — Lactation Note (Signed)
This note was copied from the chart of Maria Combs. Lactation Consultation Note New mom w/large  Cone shaped breast w/semi flat nipples, has short shaft, puffy areolas. Areolas not very compressible, reverse pressure slightly helpful. Baby unable to obtain deep latch. Fitted w/#16 and #20 NS. #20 felt more comfortable. Mom taught how to apply & clean nipple shield. Hand expression taught w/running colostrum. Breast filling heavy. Encouraged breast massage, obtained 13ml colostrum, inserted in NS for latching baby in football position. Shells given to wear in bra to assist in everting nipples, hand pump given to relieve filling breast. Baby latched well, mom stated felt much better.  Educated about newborn behavior, I&O, cluster feeding, supply and demand. Mom encouraged to do skin-to-skin.Mom encouraged to feed baby 8-12 times/24 hours and with feeding cues. Referred to Baby and Me Book in Breastfeeding section Pg. 22-23 for position options and Proper latch demonstration.Encouraged comfort during BF so colostrum flows better and mom will enjoy the feeding longer. Taking deep breaths and breast massage during BF. [place] brochure given w/resources, support groups and [facility] services. Patient Name: Maria Combs Date: 10/14/2014 Reason for consult: Initial assessment   Maternal Data Has patient been taught Hand Expression?: Yes Does the patient have breastfeeding experience prior to this delivery?: No  Feeding Feeding Type: Breast Milk Length of feed: 15 min  LATCH Score/Interventions Latch: Grasps breast easily, tongue down, lips flanged, rhythmical sucking. Intervention(s): Skin to skin;Teach feeding cues;Waking techniques  Audible Swallowing: Spontaneous and intermittent Intervention(s): Skin to skin;Hand expression  Type of Nipple: Everted at rest and after stimulation  Comfort (Breast/Nipple): Filling, red/small blisters or bruises, mild/mod discomfort  Problem  noted: Mild/Moderate discomfort Interventions (Mild/moderate discomfort): Hand massage;Hand expression;Breast shields  Hold (Positioning): Assistance needed to correctly position infant at breast and maintain latch. Intervention(s): Breastfeeding basics reviewed;Support Pillows;Position options;Skin to skin  LATCH Score: 8  Lactation Tools Discussed/Used Tools: Shells;Pump;Nipple Shields Nipple shield size: 16;20 Shell Type: Inverted Breast pump type: Manual Pump Review: Setup, frequency, and cleaning;Milk Storage Initiated by:: Allayne Stack RN Date initiated:: 10/13/14   Consult Status Consult Status: Follow-up Date: 10/14/14 Follow-up type: In-patient    Theodoro Kalata 10/14/2014, 12:58 AM

## 2014-10-14 NOTE — Lactation Note (Signed)
This note was copied from the chart of Maria Denina Slape. Lactation Consultation Note  Mother has areola swelling so is currently using #20NS to assist w/ latching. Recommend she try at least once a day to latch without NS. Plan is for mother to massage and hand express before applying NS. Apply NS and prefill using curved tip syringe w/ pumped breastmilk when available.  Breastfeed baby on both breasts pumping in between breasts.   Watch for colostrum/breastmilk in NS after feeding. Post pump w/ 2 manual pumps for 10-15 min a day.  Encouraged mother to apply for [facility] for DEBP. Give baby back volume pumped w/ next feeding.  Demonstrated how to use curved tip syringe to prefill. Once volume increases give baby back larger volume after breastfeeding w/ slow flow nipple bottle. Wear comfort gels after feeding and alternate wearing shells to help w/ swelling and latch. Reviewed engorgement care and monitoring voids/stools. Set up OP appt.Friday 10/7 2:30p. Encouraged her to call if she needs further assistance.  Patient Name: Maria Combs [id number] Date: 10/14/2014 Reason for consult: Follow-up assessment   Maternal Data    Feeding    LATCH Score/Interventions                      Lactation Tools Discussed/Used     Consult Status Consult Status: Complete    Carlye Grippe 10/14/2014, 12:08 PM

## 2014-10-14 NOTE — Discharge Summary (Signed)
  Vaginal Delivery Discharge Summary  [name]  DOB:    30-Jan-1990 MRN:    [id number] CSN:    [id number]  Date of admission:                  10/12/14  Date of discharge:                   10/15/14  Procedures this admission:   SVB, repair of bilateral periurethral and right inner labial lacerations  Date of Delivery: 10/12/14  Newborn Data:  Live born female  Birth Weight: 8 lb 5.5 oz (3785 g) APGAR: 9, 9  Home with mother. Name: Maria Combs   History of Present Illness:  [name] is a 24 y.o. female, G1P1001, who presents at 105w3d weeks gestation. The patient has been followed at [facility] Gynecology division of [facility]. She was admitted for onset of labor and rupture of membranes. Her pregnancy has been complicated by:  Patient Active Problem List   Diagnosis Date Noted  . 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 Marginal insertion of umbilical cord 67/70/3403  . Late prenatal care--17 weeks 10/12/2014  . Headache disorder 10/12/2014  . Vaginal delivery 10/12/2014  . Positive GBS test 10/12/2014  . Asthma      Hospital Course:   Admitting Dx:  IUP at 39 3/7 weeks, early labor, marginal insertion GBS Status:  Positive Delivering Clinician: Donnel Saxon, CNM Lacerations/MLE: Bilateral periurethral lacerations, right inner labia Complications: None  Intrapartum Procedures: spontaneous vaginal delivery Postpartum Procedures: none   Additional Information:  Admitted in early labor, received epidural, progressed without augmentation.  Discharge Diagnoses: Term Pregnancy-delivered and GBS positive  Feeding:  breast  Contraception:  Undecided--information reviewed.  Hemoglobin Results:  CBC Latest Ref Rng 10/13/2014 10/12/2014 06/07/2011  WBC 4.0 - 10.5 K/uL 19.0(H) 12.7(H) 8.8  Hemoglobin 12.0 - 15.0 g/dL 11.6(L) 13.2 12.9  Hematocrit 36.0 - 46.0 % 35.3(L) 39.9 39.4   Platelets 150 - 400 K/uL 203 225 317     Discharge Physical Exam:   General: alert Lochia: appropriate Uterine Fundus: firm Incision: healing well DVT Evaluation: No evidence of DVT seen on physical exam. Negative Homan's sign.   Discharge Information:  Activity:           pelvic rest Diet:                routine Medications: Ibuprofen Condition:      stable Instructions:  Routine pp instructions, contraceptive choices   Discharge to: home  Follow-up Information    Follow up with [facility]. Schedule an appointment as soon as possible for a visit in 6 weeks.   Specialty:  Obstetrics and Gynecology   Why:  Call for any questions or concerns.   Contact information:   [address] [place] [id number] [phone]       LATHAM, [place] 10/14/2014 8:18 AM

## 2014-10-22 ENCOUNTER — Ambulatory Visit (HOSPITAL_COMMUNITY)
Admit: 2014-10-22 | Discharge: 2014-10-22 | Disposition: A | Discharge status: Home / Self Care | Payer: Medicaid Other | Attending: Obstetrics and Gynecology | Admitting: Obstetrics and Gynecology

## 2014-10-22 NOTE — Lactation Note (Signed)
Lactation Consult  Mother's reason for visit: Mother scheduled a follow up visit on day of discharge for feeding assessment Visit Type: feeding assessment  Consult:  Initial Lactation Consultant:  Jess Barters McCoy  ________________________________________________________________________ Infants weight at birth was 8-5, d/c weight was 7-13, weight on 10-5 Smart Start was 8-6.  Today infants weight was 8-7.7, 3846   ________________________________________________________________________  Mother's Name: Maria Combs Type of delivery:  vaginal del  Breastfeeding Experience:  none Maternal Medical Conditions:  Asthma Maternal Medications: prn meds for asthma, prenatal vits, motrin 600  ________________________________________________________________________  Breastfeeding History (Post Discharge)  Frequency of breastfeeding: every 2hours Duration of feeding:  8-25 mins  Infant Intake and Output Assessment  Voids:  8 in 24 hrs.  Color:  Clear yellow Stools:  4-5 in 24 hrs.  Color:  Yellow Mother has a hand pump but has only use it once or twice She is not supplementing infant ________________________________________________________________________  Maternal Breast Assessment  Breast:  Full Nipple:  Erect Pain level:  0 Pain interventions:  Bra  _______________________________________________________________________ Feeding Assessment/Evaluation:  Mothers breast are full. Her nipples are intact. She is wearing inverted nipple shells. She states that they have been a big help. Infant latch on with good depth for 20-25 mins on first breast and transferred 54 ml Mother latched on alternate breast and infant took another 60 ml Infant very content.  Mother and father very happy about infants intake   Infant's oral assessment:  Variance  Positioning:  Cross cradle Right breast  LATCH documentation:  Latch:  2 = Grasps breast easily, tongue down, lips flanged,  rhythmical sucking.  Audible swallowing:  2 = Spontaneous and intermittent  Type of nipple:  2 = Everted at rest and after stimulation  Comfort (Breast/Nipple):  1 = Filling, red/small blisters or bruises, mild/mod discomfort  Hold (Positioning):  1 = Assistance needed to correctly position infant at breast and maintain latch  LATCH score:  8  Attached assessment:  Deep  Lips flanged:  Yes.    Lips untucked:  Yes.    Suck assessment:  Nutritive   Pre-feed weight:  3846, 8-7.7 Post-feed weight:  3900, Amount transferred:  54 ml   Additional Feeding Assessment - Infant latched well with good deep latch.   Infant's oral assessment:  Variance    Positioning:  Cross cradle Left breast  LATCH documentation:  Latch:  2 = Grasps breast easily, tongue down, lips flanged, rhythmical sucking.  Audible swallowing:  2 = Spontaneous and intermittent  Type of nipple:  2 = Everted at rest and after stimulation  Comfort (Breast/Nipple):  1 = Filling, red/small blisters or bruises, mild/mod discomfort  Hold (Positioning):  1 = Assistance needed to correctly position infant at breast and maintain latch  LATCH score:  8  Attached assessment:  Deep  Lips flanged:  Yes.    Lips untucked:  Yes.    Suck assessment:  Nutritive  Tools:  Shells Instructed on use and cleaning of tool:  Yes.    Pre-feed weight:  3900   Post-feed weight: 3960  Amount transferred:  60 ml    Total amount transferred:  54 ml from first breast and    60 Ml from alternate breast. Total feeding 114 ml   Advised mother to continue to breastfeed every 2-3 hours and with feeding cues Observed for growth spurts at 10 days, 3 weeks, 6 weeks and 3 months, 6 months Advised mother to follow up to [facility] as needed for  weight checks PRN Lactation hotline or visit Lots of praise and support given to parents

## 2014-10-30 ENCOUNTER — Encounter (HOSPITAL_COMMUNITY): Payer: Self-pay | Admitting: *Deleted

## 2014-10-30 ENCOUNTER — Inpatient Hospital Stay (HOSPITAL_COMMUNITY)
Admission: AD | Admit: 2014-10-30 | Discharge: 2014-10-31 | Disposition: A | Discharge status: Home / Self Care | Payer: Medicaid Other | Source: Ambulatory Visit | Attending: Obstetrics and Gynecology | Admitting: Obstetrics and Gynecology

## 2014-10-30 DIAGNOSIS — N939 Abnormal uterine and vaginal bleeding, unspecified: Secondary | ICD-10-CM

## 2014-10-30 NOTE — MAU Note (Signed)
SVD 10/12/14. For past 4 days have had vag bleeding. Today bleeding is more and passed one large clot. Having some cramping but ibuprofen is helping some

## 2014-10-31 ENCOUNTER — Inpatient Hospital Stay (HOSPITAL_COMMUNITY): Payer: Medicaid Other

## 2014-10-31 LAB — TYPE AND SCREEN
ABO/RH(D): A POS
Antibody Screen: NEGATIVE

## 2014-10-31 LAB — CBC
HCT: 37.6 % (ref 36.0–46.0)
Hemoglobin: 12.3 g/dL (ref 12.0–15.0)
MCH: 28.4 pg (ref 26.0–34.0)
MCHC: 32.7 g/dL (ref 30.0–36.0)
MCV: 86.8 fL (ref 78.0–100.0)
PLATELETS: 242 10*3/uL (ref 150–400)
RBC: 4.33 MIL/uL (ref 3.87–5.11)
RDW: 13.1 % (ref 11.5–15.5)
WBC: 6.5 10*3/uL (ref 4.0–10.5)

## 2014-10-31 LAB — WET PREP, GENITAL
Clue Cells Wet Prep HPF POC: NONE SEEN
Trich, Wet Prep: NONE SEEN
YEAST WET PREP: NONE SEEN

## 2014-10-31 MED ORDER — LACTATED RINGERS IV BOLUS (SEPSIS)
1000.0000 mL | Freq: Once | INTRAVENOUS | Status: AC
  Administered 2014-10-31: 1000 mL via INTRAVENOUS

## 2014-10-31 MED ORDER — METHYLERGONOVINE MALEATE 0.2 MG PO TABS: 0.2000 mg | ORAL_TABLET | Freq: Four times a day (QID) | ORAL | Status: DC

## 2014-10-31 MED ORDER — LACTATED RINGERS IV SOLN: INTRAVENOUS | Status: DC

## 2014-10-31 NOTE — MAU Provider Note (Signed)
History  24 yo G1 now P1, s/p vaginal delivery on 10/12/14 presents to MAU after calling w/ c/o heavy, dark red bleeding x 4 days. Today, bleeding more profuse and bright red with clots. Reports saturating more than 1 pad in an hour -- now running down her leg. Denies vaginal odor, fever, rapid heartbeat, chills, back pain, headache, malaise, anorexia or dysuria. Endorses mild cramping that is relieved w/ Ibuprofen.   States has not resumed intercourse or exercise, is breastfeeding, and not using anything for contraception. Reports normal bowel movements; no constipation.  Uncomplicated vaginal birth. EBL 107 ml. Intact placenta -- marginal cord insertion.   Patient Active Problem List   Diagnosis Date Noted  . Excessive postpartum bleeding 10/30/2014  . 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 Marginal insertion of umbilical cord 96/28/3662  . Late prenatal care--17 weeks 10/12/2014  . Headache disorder 10/12/2014  . Vaginal delivery 10/12/2014  . Positive GBS test 10/12/2014  . Asthma     Chief Complaint  Patient presents with  . Vaginal Bleeding   HPI As above OB History    Gravida Para Term Preterm AB TAB SAB Ectopic Multiple Living   1 1 1       0 1      Past Medical History  Diagnosis Date  . Brain tumor (benign) ([place])   . Fibrous dysplasia of bone   . Asthma     inhaler prn, last used Sept. 2016    Past Surgical History  Procedure Laterality Date  . Cranial surgery      Family History  Problem Relation Age of Onset  . Asthma Sister     Social History  Substance Use Topics  . Smoking status: Never Smoker   . Smokeless tobacco: None  . Alcohol Use: No    Allergies: No Known Allergies  No prescriptions prior to admission    ROS 10 point review of systems negative unless otherwise noted in HPI.     Physical Exam  CLINICAL DATA: Heavy bleeding with clots since 10/30/2014. Post vaginal delivery on  10/12/2014.  EXAM: TRANSABDOMINAL ULTRASOUND OF PELVIS  TECHNIQUE: Transabdominal ultrasound examination of the pelvis was performed including evaluation of the uterus, ovaries, adnexal regions, and pelvic cul-de-sac.  COMPARISON: 06/29/2014  FINDINGS: Uterus  Measurements: 10.2 x 5.8 x 7.3 cm. Uterus is anteverted. No fibroids or other mass visualized.  Endometrium  Thickness: Endometrium is mildly thickened at 14.4 mm stripe thickness. Endometrium is heterogeneous with mixed hypoechoic and hyperechoic material present. Eccentric hyperechoic focus demonstrated in the fundal region. Color flow Doppler imaging demonstrates flow within the endometrial contents. Appearance suggest retained products of conception.  Right ovary  Measurements: 3.4 x 1.8 x 2.3 cm. Normal appearance/no adnexal mass.  Left ovary  Measurements: 2.9 x 1.4 x 1.6 cm. Normal appearance/no adnexal mass.  Other findings: No free fluid  IMPRESSION: Heterogeneous appearance of thickened endometrium with flow demonstrated within the endometrial contents. Appearance suggest retained products of conception.   Electronically Signed  By: Lucienne Capers M.D.  On: 10/31/2014 01:42   Results for orders placed or performed during the hospital encounter of 10/30/14 (from the past 72 hour(s))  GC/Chlamydia probe amp ([place])not at [facility]     Status: None   Collection Time: 10/31/14 12:00 AM  Result Value Ref Range   Chlamydia Negative     Comment: Normal Reference Range - Negative   Neisseria gonorrhea Negative     Comment: Normal Reference Range -  Negative  Wet prep, genital     Status: Abnormal   Collection Time: 10/31/14 12:55 AM  Result Value Ref Range   Yeast Wet Prep HPF POC NONE SEEN NONE SEEN   Trich, Wet Prep NONE SEEN NONE SEEN   Clue Cells Wet Prep HPF POC NONE SEEN NONE SEEN   WBC, Wet Prep HPF POC FEW (A) NONE SEEN    Comment: FEW BACTERIA SEEN  CBC     Status:  None   Collection Time: 10/31/14  1:05 AM  Result Value Ref Range   WBC 6.5 4.0 - 10.5 K/uL   RBC 4.33 3.87 - 5.11 MIL/uL   Hemoglobin 12.3 12.0 - 15.0 g/dL   HCT 37.6 36.0 - 46.0 %   MCV 86.8 78.0 - 100.0 fL   MCH 28.4 26.0 - 34.0 pg   MCHC 32.7 30.0 - 36.0 g/dL   RDW 13.1 11.5 - 15.5 %   Platelets 242 150 - 400 K/uL  Type and screen WILL OBTAIN WITH IV START     Status: None   Collection Time: 10/31/14  1:05 AM  Result Value Ref Range   ABO/RH(D) A POS    Antibody Screen NEG    Sample Expiration 11/03/2014    Blood pressure 134/87, pulse 54, temperature 98.6 F (37 C), resp. rate 18, height 5\' 6"  (1.676 m), weight 72.666 kg (160 lb 3.2 oz), unknown if currently breastfeeding.    Physical Exam Gen: NAD Lungs: CTAB CV: RRR w/o M/R/G Abdomen: soft, NT, no rebound tenderness or guarding External genitalia: No external erythema, edema or excoriation Vagina: No lesions SSE: Copious dark red blood/quarter sized clots in vault; no odor appreciated. Sample collected for wet prep and GC/CT. Bleeding slowed w/ direct pressure  Ext: WNL  ED Course  CBC U/S Wet mount GC/CT   Assessment: S/P uncomplicated vaginal birth on 10/12/14. Adequately treated for + GBS Normal vital signs Findings inconsistent w/ endometritis U/S results as above Rh positive  Plan: IVFs Consulted Dr. Cletis Media. Will proceed w/ Methergine series (x 24 hrs)/Ibuprofen combo. Pt to f/u in office on Tuesday, 11/02/14. Return to MAU for worsening symptoms, or if saturating more than 1 pad in 3 hrs. D/C'd home in stable condition.   Farrel Gordon CNM, MS 10/31/14, 01:01 AM

## 2014-11-01 LAB — GC/CHLAMYDIA PROBE AMP (~~LOC~~) NOT AT ARMC
Chlamydia: NEGATIVE
NEISSERIA GONORRHEA: NEGATIVE

## 2015-11-16 ENCOUNTER — Other Ambulatory Visit (HOSPITAL_COMMUNITY)
Admission: RE | Admit: 2015-11-16 | Discharge: 2015-11-16 | Disposition: A | Discharge status: Home / Self Care | Payer: Medicaid Other | Source: Ambulatory Visit | Attending: Family Medicine | Admitting: Family Medicine

## 2015-11-16 ENCOUNTER — Ambulatory Visit (INDEPENDENT_AMBULATORY_CARE_PROVIDER_SITE_OTHER): Payer: Medicaid Other | Admitting: Family Medicine

## 2015-11-16 ENCOUNTER — Other Ambulatory Visit: Payer: Self-pay

## 2015-11-16 ENCOUNTER — Ambulatory Visit (HOSPITAL_COMMUNITY)
Admission: RE | Admit: 2015-11-16 | Discharge: 2015-11-16 | Disposition: A | Discharge status: Home / Self Care | Payer: Medicaid Other | Source: Ambulatory Visit | Attending: Family Medicine | Admitting: Family Medicine

## 2015-11-16 ENCOUNTER — Encounter: Payer: Self-pay | Admitting: Family Medicine

## 2015-11-16 VITALS — BP 112/74 | HR 88 | Temp 98.1°F | Wt 158.0 lb

## 2015-11-16 DIAGNOSIS — Z113 Encounter for screening for infections with a predominantly sexual mode of transmission: Secondary | ICD-10-CM | POA: Diagnosis not present

## 2015-11-16 DIAGNOSIS — Z23 Encounter for immunization: Secondary | ICD-10-CM

## 2015-11-16 DIAGNOSIS — R9431 Abnormal electrocardiogram [ECG] [EKG]: Secondary | ICD-10-CM | POA: Diagnosis not present

## 2015-11-16 DIAGNOSIS — R55 Syncope and collapse: Secondary | ICD-10-CM | POA: Insufficient documentation

## 2015-11-16 DIAGNOSIS — R51 Headache: Secondary | ICD-10-CM

## 2015-11-16 DIAGNOSIS — R519 Headache, unspecified: Secondary | ICD-10-CM

## 2015-11-16 DIAGNOSIS — Z202 Contact with and (suspected) exposure to infections with a predominantly sexual mode of transmission: Secondary | ICD-10-CM | POA: Diagnosis not present

## 2015-11-16 LAB — CBC
HEMATOCRIT: 41.3 % (ref 35.0–45.0)
HEMOGLOBIN: 13.1 g/dL (ref 11.7–15.5)
MCH: 27 pg (ref 27.0–33.0)
MCHC: 31.7 g/dL — AB (ref 32.0–36.0)
MCV: 85.2 fL (ref 80.0–100.0)
MPV: 9.5 fL (ref 7.5–12.5)
Platelets: 323 10*3/uL (ref 140–400)
RBC: 4.85 MIL/uL (ref 3.80–5.10)
RDW: 13.3 % (ref 11.0–15.0)
WBC: 12.1 10*3/uL — ABNORMAL HIGH (ref 3.8–10.8)

## 2015-11-16 NOTE — Progress Notes (Signed)
CC: new patient  HPI Back here due to insurance changing her network of physicians, also didn't see our clinic while she was pregnant and seeing [facility].  Headaches: 2002 - noticed a bump on her head. 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 Second surgery for reconstruction in 2004-5 with plate placement. Doesn't remember name of neurosurgeon, this was in [place]. Told she would be prone to headaches. Caused pressure on her right eye, which made eyesight worse. Headaches now come and go, some weeks they come every day, but can last anywhere from 22min-2hours. Sometimes pin like feelings to diffuse pain. Focus of pain can be on front or back of the top of her head. + photophobia, cold rag helps. Tylenol helps. Triggers unknown. Occasionally tries to sleep them off and still wakes up with headache. Trouble seeing while driving at night. Recent visit to eye doctor with ok checkup. New glasses were supposed to help her see better at night, this has not been true. Feels the headaches could be contributing to her vision troubles.  Fainting spells: occur while standing after >55min, presyncopal feelings of heaviness in her extremities and darkening of vision without room spinning. Started 2 years ago. Didn't happen while pregnant. Last month increased frequency. She isn't sure what is causing this. Denies palpitations.   Birth control: abstinence. Unsure of future pregnancy plans. Wants STI screening.  CC, SH/smoking status, and VS noted  Objective: BP 112/74   Pulse 88   Temp 98.1 F (36.7 C) (Oral)   Wt 158 lb (71.7 kg)   LMP 11/09/2015   SpO2 100%   BMI 25.50 kg/m  Gen: NAD, alert, cooperative, and pleasant. HEENT: NCAT, EOMI, PERRL CV: RRR, no murmur Resp: CTAB, no wheezes, non-labored Abd: SNTND, BS present, no guarding or organomegaly Ext: No edema, warm Neuro: Alert and oriented, Speech clear, No gross deficits. CN II-XII grossly intact,  sensation and strength intact in all extremities.  Assessment and plan:  Syncope Patient reports fainting spells starting 2 years ago with presyncopal sensations of darkening and heavy extremities. Has not had this worked up at all in the past. Will conduct syncopal workup - EKG reading left posterior fascicular block, but does not meet criteria for this (no widened QRS, minimal Q waves). Will refer to cardiology for syncopal workup as well as possible holter monitor. Ordered TSH, CBC, CMP. Encouraged good hydration.  Headache disorder History of cranial surgery and plate placement. Headache history sounds like migraines. No alarm symptoms, neuro exam normal. Encouraged good headache hygiene - minimal caffeine, good sleep, good hydration, judicious use of pain relievers. Patient will keep a headache diary prior to follow up appointment in 2 weeks. Would need to discuss possible imaging with radiology based on history of plate indwelling.  Health maintenance:  -Gc/c urine sent -HIV and RPR sent while obtaining blood work -flu shot given today -patient states she had a pap last year at [place] This Encounter  Procedures  . Flu Vaccine QUAD 36+ mos IM  . CBC  . COMPLETE METABOLIC PANEL WITH GFR  . HIV antibody (with reflex)  . TSH  . T4, free  . RPR  . Ambulatory referral to Cardiology    Referral Priority:   Routine    Referral Type:   Consultation    Referral Reason:   Specialty Services Required    Requested Specialty:   Cardiology    Number of Visits Requested:   1  Ralene Ok, MD, PGY1 11/16/2015 4:13 PM

## 2015-11-16 NOTE — Patient Instructions (Signed)
It was a pleasure to meet you today. I want to have you see cardiology. In the meantime, I will call you if your labs are abnormal. Also, please keep a headache diary.

## 2015-11-16 NOTE — Assessment & Plan Note (Signed)
History of cranial surgery and plate placement. Headache history sounds like migraines. No alarm symptoms, neuro exam normal. Encouraged good headache hygiene - minimal caffeine, good sleep, good hydration, judicious use of pain relievers. Patient will keep a headache diary prior to follow up appointment in 2 weeks. Would need to discuss possible imaging with radiology based on history of plate indwelling.

## 2015-11-16 NOTE — Assessment & Plan Note (Addendum)
Patient reports fainting spells starting 2 years ago with presyncopal sensations of darkening and heavy extremities. Has not had this worked up at all in the past. Will conduct syncopal workup - EKG reading left posterior fascicular block, but does not meet criteria for this (no widened QRS, minimal Q waves). Will refer to cardiology for syncopal workup as well as possible holter monitor. Ordered TSH, CBC, CMP. Encouraged good hydration. Orthostatics negative, but patient did not want to keep standing for 3 minute values.

## 2015-11-17 LAB — HIV ANTIBODY (ROUTINE TESTING W REFLEX): HIV: NONREACTIVE

## 2015-11-17 LAB — COMPLETE METABOLIC PANEL WITH GFR
ALBUMIN: 4.4 g/dL (ref 3.6–5.1)
ALK PHOS: 82 U/L (ref 33–115)
ALT: 13 U/L (ref 6–29)
AST: 20 U/L (ref 10–30)
BILIRUBIN TOTAL: 0.3 mg/dL (ref 0.2–1.2)
BUN: 14 mg/dL (ref 7–25)
CALCIUM: 9.2 mg/dL (ref 8.6–10.2)
CO2: 26 mmol/L (ref 20–31)
Chloride: 103 mmol/L (ref 98–110)
Creat: 0.59 mg/dL (ref 0.50–1.10)
GFR, Est African American: >89 mL/min (ref >=60)
GLUCOSE: 76 mg/dL (ref 65–99)
POTASSIUM: 4.4 mmol/L (ref 3.5–5.3)
SODIUM: 140 mmol/L (ref 135–146)
TOTAL PROTEIN: 7.9 g/dL (ref 6.1–8.1)

## 2015-11-17 LAB — TSH: TSH: 0.84 mIU/L

## 2015-11-17 LAB — RPR

## 2015-11-17 LAB — T4, FREE: Free T4: 1.3 ng/dL (ref 0.8–1.8)

## 2015-11-18 LAB — URINE CYTOLOGY ANCILLARY ONLY
CHLAMYDIA, DNA PROBE: NEGATIVE
NEISSERIA GONORRHEA: NEGATIVE

## 2015-11-21 ENCOUNTER — Telehealth: Payer: Self-pay | Admitting: Family Medicine

## 2015-11-21 NOTE — Telephone Encounter (Signed)
Called patient on her cell to discuss recent labs. WBC 12.1 last week, most recent prior was 6.5 1 year ago. Denies any infectious symptoms at this time, we will recheck at next visit with a differential. Reassured her that other labs were normal.

## 2015-11-29 ENCOUNTER — Ambulatory Visit (INDEPENDENT_AMBULATORY_CARE_PROVIDER_SITE_OTHER): Payer: Medicaid Other | Admitting: Cardiology

## 2015-11-29 ENCOUNTER — Encounter: Payer: Self-pay | Admitting: Cardiology

## 2015-11-29 VITALS — BP 119/73 | HR 84 | Ht 66.0 in | Wt 157.0 lb

## 2015-11-29 DIAGNOSIS — R55 Syncope and collapse: Secondary | ICD-10-CM

## 2015-11-29 NOTE — Progress Notes (Signed)
Electrophysiology Office Note   Date:  11/29/2015   ID:  Maria, Combs 1990-09-23, MRN [id number]  PCP:  Loni Muse, MD Primary Electrophysiologist:  Kriste Broman Jorja Loa, MD    Chief Complaint  Patient presents with  . New Patient (Initial Visit)    syncope     History of Present Illness: Maria Combs is a 25 y.o. female who presents today for electrophysiology evaluation.   Has been having fainting spells.  Occur when standing longer than 30 minutes. Feelings of heaviness in extremities and darkening of vision without room spinning. Started 2 years ago. Denies palpitations. Did not occur when she was pregnant.   She says that last March, she developed chest pain and palpitations. She did not yet have a primary physician, so she went to the emergency room. She was told that her chest pain likely musculoskeletal in nature was told to take ibuprofen. Since then, she has continued to have episodes of palpitations with associated chest pain. She says that the episodes occur roughly 2 times per week. She also says that she has passed out a few times. The most recent was approximately a month and a half ago. She was standing at the meter cleaning out her years when it occurred. She did feel associated palpitations and chest discomfort at the time. She had been standing for 30 minutes. She did not have any injuries.  Today, she denies symptoms of palpitations, chest pain, shortness of breath, orthopnea, PND, lower extremity edema, claudication, dizziness, presyncope, syncope, bleeding, or neurologic sequela. The patient is tolerating medications without difficulties and is otherwise without complaint today.    Past Medical History:  Diagnosis Date  . Asthma    inhaler prn, last used Sept. 2016  . Brain tumor (benign) ([facility])   . Fibrous dysplasia of bone    Past Surgical History:  Procedure Laterality Date  . cranial surgery       Current Outpatient Prescriptions    Medication Sig Dispense Refill  . albuterol (PROVENTIL HFA;VENTOLIN HFA) 108 (90 BASE) MCG/ACT inhaler Inhale 2 puffs into the lungs every 4 (four) hours as needed for wheezing or shortness of breath. 1 Inhaler 3  . ibuprofen (ADVIL,MOTRIN) 600 MG tablet Take 1 tablet (600 mg total) by mouth every 6 (six) hours as needed. 30 tablet 2  . Prenatal Vit-Fe Fumarate-FA (PRENATAL PO) Take 1 tablet by mouth daily.     No current facility-administered medications for this visit.     Allergies:   Patient has no known allergies.   Social History:  The patient  reports that she has never smoked. She does not have any smokeless tobacco history on file. She reports that she does not drink alcohol or use drugs.   Family History:  The patient's family history includes Asthma in her sister.    ROS:  Please see the history of present illness.   Otherwise, review of systems is positive for appetite change chest pressure, palpitations, chest pain, hearing loss, abdominal pain, back pain, dizziness, passing out, headaches.   All other systems are reviewed and negative.    PHYSICAL EXAM: VS:  BP 119/73   Pulse 84   Ht 5\' 6"  (1.676 m)   Wt 157 lb (71.2 kg)   LMP 11/09/2015   BMI 25.34 kg/m  , BMI Body mass index is 25.34 kg/m. GEN: Well nourished, well developed, in no acute distress  HEENT: normal  Neck: no JVD, carotid bruits, or masses Cardiac: RRR; no  murmurs, rubs, or gallops,no edema  Respiratory:  clear to auscultation bilaterally, normal work of breathing GI: soft, nontender, nondistended, + BS MS: no deformity or atrophy  Skin: warm and dry Neuro:  Strength and sensation are intact Psych: euthymic mood, full affect  EKG:  EKG is not ordered today. Personal review of the ekg ordered 11/29/15 shows sinus rhythm, RsR', RAD, rate 77   Orthostatic VS for the past 24 hrs:  BP- Lying Pulse- Lying BP- Sitting Pulse- Sitting BP- Standing at 0 minutes Pulse- Standing at 0 minutes  11/29/15  1459 110/66 76 123/82 77 125/84 88      Recent Labs: 11/16/2015: ALT 13; BUN 14; Creat 0.59; Hemoglobin 13.1; Platelets 323; Potassium 4.4; Sodium 140; TSH 0.84    Lipid Panel  No results found for: CHOL, TRIG, HDL, CHOLHDL, VLDL, LDLCALC, LDLDIRECT   Wt Readings from Last 3 Encounters:  11/29/15 157 lb (71.2 kg)  11/16/15 158 lb (71.7 kg)  10/30/14 160 lb 3.2 oz (72.7 kg)      Other studies Reviewed: Additional studies/ records that were reviewed today include: PCP notes   ASSESSMENT AND PLAN:  1.  Syncope: At this time, it is difficult to determine the causes of her syncope. It is unusual for her to have syncope after standing for 30 minutes to be orthostatic. She is not currently orthostatic in clinic, though her pulse did go up on standing.   2. Palpitations: Her EKG does not give any insight into the cause of her palpitations. We'll therefore arrange for an event monitor to hopefully make a firm diagnosis. It is possible that her palpitations are causing her chest pain and episode of syncope. I have told her no driving due to the episode of syncope.    Current medicines are reviewed at length with the patient today.   The patient does not have concerns regarding her medicines.  The following changes were made today:  none  Labs/ tests ordered today include:  Orders Placed This Encounter  Procedures  . Cardiac event monitor     Disposition:   FU with Zamir Staples 6 weeks  Signed, Jemimah Cressy Jorja Loa, MD  11/29/2015 3:40 PM     [facility] [address] [place] [address] [phone] (office) [phone] (fax)

## 2015-11-29 NOTE — Patient Instructions (Addendum)
Medication Instructions: - Your physician recommends that you continue on your current medications as directed. Please refer to the Current Medication list given to you today.  Labwork: - none ordered  Procedures/Testing: - Your physician has recommended that you wear a 2 week event monitor. Event monitors are medical devices that record the heart's electrical activity. Doctors most often [place] these monitors to diagnose arrhythmias. Arrhythmias are problems with the speed or rhythm of the heartbeat. The monitor is a small, portable device. You can wear one while you do your normal daily activities. This is usually used to diagnose what is causing palpitations/syncope (passing out).  Follow-Up: - Your physician recommends that you schedule a follow-up appointment in: 6 weeks with Dr. Curt Bears.  Any Additional Special Instructions Will Be Listed Below (If Applicable).     If you need a refill on your cardiac medications before your next appointment, please call your pharmacy.

## 2015-12-01 ENCOUNTER — Ambulatory Visit (INDEPENDENT_AMBULATORY_CARE_PROVIDER_SITE_OTHER): Payer: Medicaid Other | Admitting: Family Medicine

## 2015-12-01 VITALS — BP 107/70 | HR 93 | Temp 98.4°F | Ht 66.0 in | Wt 157.0 lb

## 2015-12-01 DIAGNOSIS — R519 Headache, unspecified: Secondary | ICD-10-CM

## 2015-12-01 DIAGNOSIS — R51 Headache: Secondary | ICD-10-CM

## 2015-12-01 NOTE — Assessment & Plan Note (Signed)
Given history of new unilateral tinnitus, will order imaging. Discussed the case with neuroradiologist, he was able to visualize previous head CT from 2007 that I cannot access, and visualized the plate. He did not see any metal that would prevent her from getting an MRI, although he recommended a noncontrast head CT first. Counseled the patient that she can continue to use a headache diary, as well as take Tylenol as needed. Will have her back in one month to discuss additional headache treatment, will also call her with imaging results. Hearing test normal bilaterally.

## 2015-12-01 NOTE — Patient Instructions (Signed)
It was a pleasure to see you today! Your daughter is gorgeous. Thank you so much for keeping your headache diary, please continue to do that as you are able. I think it would be best for [place] to get imaging of your head, and I will discuss the case with the radiologist before choosing an image type. They will call you to schedule that. I'd like to see you back in one month, to check on higher headaches are doing. I will call you with results of the image of your head. Go to the emergency room if you have sudden onset weakness,  sudden vision changes, or headache that will not go away.

## 2015-12-01 NOTE — Progress Notes (Signed)
CC: f/u headache  HPI Successfully kept headache journal over the past 2 weeks. Headaches have been approximately every 2-3 days. Varying presentations including a couple on awakening, some wall sitting with acute onset to the nape of the neck. Some throbbing over her surgical site on the right temporal region. Additionally, has been having intermittent unilateral tinnitus of left ear. No history of this, this is new for her. Most of these headaches lasted less than 30 minutes, and not require any treatment. She has been using Tylenol intermittently for th remainder of her headaches with good relief. For her syncope discussed at previous visit, she saw cardiology, they are going to place a 2 week monitor which she is to receive next week.     Onset: quick, occasionally on awakening   Location: R temporal, R nape of neck, top of head.   Quality: Sharp, throbbing. Heavy weight.  Frequency: every 2-3 days.   Precipitating factors: random, none identified  Prior treatment: ibuprofen and tylenol  Associated Symptoms Nausea/vomiting: yes N, no V Photophobia/phonophobia: no  Tearing of eyes: no  Sinus pain/pressure: no  Family hx migraine: no  Personal stressors: maybe disagreements with FOB Relation to menstrual cycle: no   Red Flags Fever: no  Neck pain/stiffness: occasional with headaches  Vision/speech/swallow/hearing difficulty: ringing in L ear, no vision changes, no trouble swallowing or talking. Smells a smell like cigarette smoke without exposure, not always with HAs.   Focal weakness/numbness: no  Altered mental status: no  Trauma: no  New type of headache: no  Anticoagulant use: no  H/o cancer/HIV/Pregnancy: no    CC, SH/smoking status, and VS noted  Objective: BP 107/70   Pulse 93   Temp 98.4 F (36.9 C) (Oral)   Ht 5\' 6"  (1.676 m)   Wt 157 lb (71.2 kg)   LMP 11/09/2015   BMI 25.34 kg/m  Gen: NAD, alert, cooperative, and pleasant. HEENT: NCAT, EOMI, PERRL. TMs  clear.  CV: RRR, no murmur Resp: CTAB, no wheezes, non-labored Abd: SNTND, BS present, no guarding or organomegaly Ext: No edema, warm Neuro: Alert and oriented, Speech clear, No gross deficits.   Assessment and plan:  Headache disorder Given history of new unilateral tinnitus, will order imaging. Discussed the case with neuroradiologist, he was able to visualize previous head CT from 2007 that I cannot access, and visualized the plate. He did not see any metal that would prevent her from getting an MRI, although he recommended a noncontrast head CT first. Counseled the patient that she can continue to use a headache diary, as well as take Tylenol as needed. Will have her back in one month to discuss additional headache treatment, will also call her with imaging results. Hearing test normal bilaterally.   Orders Placed This Encounter  Procedures  . 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 One prior head CT that I can't access, but radiologist I called could.    Standing Status:   Future    Standing Expiration Date:   03/02/2017    Order Specific Question:   Reason for Exam (SYMPTOM  OR DIAGNOSIS REQUIRED)    Answer:   unilateral tinnitus    Order Specific Question:   Is patient pregnant?    Answer:   No    Order Specific Question:   Preferred imaging location?    Answer:   [facility]  Ralene Ok, MD, PGY1 12/01/2015 4:55 PM

## 2015-12-07 ENCOUNTER — Ambulatory Visit (INDEPENDENT_AMBULATORY_CARE_PROVIDER_SITE_OTHER): Payer: Medicaid Other

## 2015-12-07 DIAGNOSIS — R55 Syncope and collapse: Secondary | ICD-10-CM | POA: Diagnosis not present

## 2015-12-12 ENCOUNTER — Telehealth: Payer: Self-pay | Admitting: Family Medicine

## 2015-12-12 ENCOUNTER — Telehealth: Payer: Self-pay | Admitting: Cardiology

## 2015-12-12 ENCOUNTER — Ambulatory Visit (INDEPENDENT_AMBULATORY_CARE_PROVIDER_SITE_OTHER): Payer: Medicaid Other | Admitting: Family Medicine

## 2015-12-12 ENCOUNTER — Encounter: Payer: Self-pay | Admitting: Family Medicine

## 2015-12-12 VITALS — BP 122/68 | HR 103 | Temp 98.7°F | Wt 154.0 lb

## 2015-12-12 DIAGNOSIS — N912 Amenorrhea, unspecified: Secondary | ICD-10-CM

## 2015-12-12 DIAGNOSIS — Z3490 Encounter for supervision of normal pregnancy, unspecified, unspecified trimester: Secondary | ICD-10-CM | POA: Insufficient documentation

## 2015-12-12 DIAGNOSIS — Z3201 Encounter for pregnancy test, result positive: Secondary | ICD-10-CM | POA: Diagnosis not present

## 2015-12-12 LAB — POCT URINE PREGNANCY: PREG TEST UR: POSITIVE — AB

## 2015-12-12 NOTE — Telephone Encounter (Signed)
Called patient to let her know that I canceled the head CT as she is newly pregnant. She states that [facility] is not taking any new patients, even though she has previously been a patient of theirs, and she would like to see me for her prenatal care. She is going to call the office line now to get a new OB lab appt and then see me as her OB continuity provider.

## 2015-12-12 NOTE — Patient Instructions (Addendum)
Congratulations! It was a pleasure seeing you today in our clinic. Today we discussed your new pregnancy. Here is the treatment plan we have discussed and agreed upon together:   - At this time there should be any need for a referral to [facility]. That said, if you contact their office and for one reason or another they state that a referral is needed then eye, or your PCP, would be happy to order this referral for you. - I wish you the best of luck in this pregnancy! Do not hesitate to contact our office for any questions or concerns.    First Trimester of Pregnancy The first trimester of pregnancy is from week 1 until the end of week 12 (months 1 through 3). During this time, your baby will begin to develop inside you. At 6-8 weeks, the eyes and face are formed, and the heartbeat can be seen on ultrasound. At the end of 12 weeks, all the baby's organs are formed. Prenatal care is all the medical care you receive before the birth of your baby. Make sure you get good prenatal care and follow all of your doctor's instructions. Follow these instructions at home: Medicines  Take medicine only as told by your doctor. Some medicines are safe and some are not during pregnancy.  Take your prenatal vitamins as told by your doctor.  Take medicine that helps you poop (stool softener) as needed if your doctor says it is okay. Diet  Eat regular, healthy meals.  Your doctor will tell you the amount of weight gain that is right for you.  Avoid raw meat and uncooked cheese.  If you feel sick to your stomach (nauseous) or throw up (vomit):  Eat 4 or 5 small meals a day instead of 3 large meals.  Try eating a few soda crackers.  Drink liquids between meals instead of during meals.  If you have a hard time pooping (constipation):  Eat high-fiber foods like fresh vegetables, fruit, and whole grains.  Drink enough fluids to keep your pee (urine) clear or pale yellow. Activity and  Exercise  Exercise only as told by your doctor. Stop exercising if you have cramps or pain in your lower belly (abdomen) or low back.  Try to avoid standing for long periods of time. Move your legs often if you must stand in one place for a long time.  Avoid heavy lifting.  Wear low-heeled shoes. Sit and stand up straight.  You can have sex unless your doctor tells you not to. Relief of Pain or Discomfort  Wear a good support bra if your breasts are sore.  Take warm water baths (sitz baths) to soothe pain or discomfort caused by hemorrhoids. Use hemorrhoid cream if your doctor says it is okay.  Rest with your legs raised if you have leg cramps or low back pain.  Wear support hose if you have puffy, bulging veins (varicose veins) in your legs. Raise (elevate) your feet for 15 minutes, 3-4 times a day. Limit salt in your diet. Prenatal Care  Schedule your prenatal visits by the twelfth week of pregnancy.  Write down your questions. Take them to your prenatal visits.  Keep all your prenatal visits as told by your doctor. Safety  Wear your seat belt at all times when driving.  Make a list of emergency phone numbers. The list should include numbers for family, friends, the hospital, and police and fire departments. General Tips  Ask your doctor for a referral to  a local prenatal class. Begin classes no later than at the start of month 6 of your pregnancy.  Ask for help if you need counseling or help with nutrition. Your doctor can give you advice or tell you where to go for help.  Do not use hot tubs, steam rooms, or saunas.  Do not douche or use tampons or scented sanitary pads.  Do not cross your legs for long periods of time.  Avoid litter boxes and soil used by cats.  Avoid all smoking, herbs, and alcohol. Avoid drugs not approved by your doctor.  Do not use any tobacco products, including cigarettes, chewing tobacco, and electronic cigarettes. If you need help  quitting, ask your doctor. You may get counseling or other support to help you quit.  Visit your dentist. At home, brush your teeth with a soft toothbrush. Be gentle when you floss. Get help if:  You are dizzy.  You have mild cramps or pressure in your lower belly.  You have a nagging pain in your belly area.  You continue to feel sick to your stomach, throw up, or have watery poop (diarrhea).  You have a bad smelling fluid coming from your vagina.  You have pain with peeing (urination).  You have increased puffiness (swelling) in your face, hands, legs, or ankles. Get help right away if:  You have a fever.  You are leaking fluid from your vagina.  You have spotting or bleeding from your vagina.  You have very bad belly cramping or pain.  You gain or lose weight rapidly.  You throw up blood. It may look like coffee grounds.  You are around people who have [place] measles, fifth disease, or chickenpox.  You have a very bad headache.  You have shortness of breath.  You have any kind of trauma, such as from a fall or a car accident. This information is not intended to replace advice given to you by your health care provider. Make sure you discuss any questions you have with your health care provider. Document Released: 06/20/2007 Document Revised: 06/09/2015 Document Reviewed: 11/11/2012 Elsevier Interactive Patient Education  2017 [employer].

## 2015-12-12 NOTE — Progress Notes (Signed)
   HPI  CC: Pregnancy confirmation Patient is here for pregnancy confirmation. She states that she recently took a pregnancy test at home which resulted positively. Patient appears very happy to be pregnant at this time and has no significance questions. She denies any vaginal bleeding, discharge, cramping, or abdominal pain. She has a 55-month-old with her today. She states that she would like to undergo prenatal care at the same OB/GYN that she saw for her 13-month-old. She is asking whether or not she needs a referral for this to occur.  Review of Systems    See HPI for ROS. All other systems reviewed and are negative.  CC, SH/smoking status, and VS noted  Objective: BP 122/68   Pulse (!) 103   Temp 98.7 F (37.1 C) (Oral)   Wt 154 lb (69.9 kg)   LMP 11/09/2015   BMI 24.86 kg/m  Gen: NAD, alert, cooperative, and pleasant. CV: RRR, no murmur Resp: CTAB, no wheezes, non-labored  Assessment and plan:  Pregnancy test positive Patient is here to confirm her pregnancy. Pregnancy test clinic today was positive. This is the obvious cause of her amenorrhea. Patient would like to seek prenatal care with [facility]. To my understanding she would not need any referrals at this time as she was just cared by them 14 months ago for her daughter. - No referral placed today; but if necessary I would be happy to write this order. - Encouraged calling to set up an initial prenatal visit later this week with [facility]. - Daily prenatal vitamin   Orders Placed This Encounter  Procedures  . POCT urine pregnancy    Elberta Leatherwood, MD,MS,  PGY3 12/12/2015 3:32 PM

## 2015-12-12 NOTE — Telephone Encounter (Signed)
I scheduled pt for new OB visit on Dec. 15th and labs for Dec. 11th. Just wanted to let you know in case labs were not already in the computer. Thanks! ep

## 2015-12-12 NOTE — Assessment & Plan Note (Signed)
Patient is here to confirm her pregnancy. Pregnancy test clinic today was positive. This is the obvious cause of her amenorrhea. Patient would like to seek prenatal care with [facility]. To my understanding she would not need any referrals at this time as she was just cared by them 14 months ago for her daughter. - No referral placed today; but if necessary I would be happy to write this order. - Encouraged calling to set up an initial prenatal visit later this week with [facility]. - Daily prenatal vitamin

## 2015-12-12 NOTE — Telephone Encounter (Signed)
New Message:   Pt says she is supposed to be wearing a monitor until 12-21-15.She found out she was pregnant,wants to know if she should continue to wear the monitor? What should she do?

## 2015-12-12 NOTE — Telephone Encounter (Signed)
Advised that it is ok/safe for her to continue wearing holter monitor while pregnant. She thanks me for information and agreeable to continue to wear monitor.

## 2015-12-13 ENCOUNTER — Other Ambulatory Visit: Payer: Self-pay | Admitting: Family Medicine

## 2015-12-13 DIAGNOSIS — Z3481 Encounter for supervision of other normal pregnancy, first trimester: Secondary | ICD-10-CM

## 2015-12-26 ENCOUNTER — Other Ambulatory Visit (INDEPENDENT_AMBULATORY_CARE_PROVIDER_SITE_OTHER): Payer: Medicaid Other

## 2015-12-26 DIAGNOSIS — Z3481 Encounter for supervision of other normal pregnancy, first trimester: Secondary | ICD-10-CM

## 2015-12-26 LAB — POCT URINALYSIS DIP (MANUAL ENTRY)
Bilirubin, UA: NEGATIVE
Blood, UA: NEGATIVE
GLUCOSE UA: NEGATIVE
LEUKOCYTES UA: NEGATIVE
Nitrite, UA: NEGATIVE
Spec Grav, UA: >=1.03
Urobilinogen, UA: 0.2
pH, UA: 6.5

## 2015-12-26 LAB — POCT UA - MICROSCOPIC ONLY

## 2015-12-27 LAB — OBSTETRIC PANEL
Antibody Screen: NEGATIVE
BASOS ABS: 0 {cells}/uL (ref 0–200)
Basophils Relative: 0 %
EOS ABS: 107 {cells}/uL (ref 15–500)
Eosinophils Relative: 1 %
HCT: 38.5 % (ref 35.0–45.0)
HEMOGLOBIN: 12.4 g/dL (ref 11.7–15.5)
HEP B S AG: NEGATIVE
Lymphocytes Relative: 17 %
Lymphs Abs: 1819 cells/uL (ref 850–3900)
MCH: 27.4 pg (ref 27.0–33.0)
MCHC: 32.2 g/dL (ref 32.0–36.0)
MCV: 85 fL (ref 80.0–100.0)
MONOS PCT: 4 %
MPV: 9.8 fL (ref 7.5–12.5)
Monocytes Absolute: 428 cells/uL (ref 200–950)
NEUTROS ABS: 8346 {cells}/uL — AB (ref 1500–7800)
NEUTROS PCT: 78 %
Platelets: 309 10*3/uL (ref 140–400)
RBC: 4.53 MIL/uL (ref 3.80–5.10)
RDW: 13.7 % (ref 11.0–15.0)
RUBELLA: 2.42 {index} — AB (ref <0.90)
Rh Type: POSITIVE
WBC: 10.7 10*3/uL (ref 3.8–10.8)

## 2015-12-27 LAB — SICKLE CELL SCREEN: SICKLE CELL SCREEN: NEGATIVE

## 2015-12-27 LAB — HIV ANTIBODY (ROUTINE TESTING W REFLEX): HIV: NONREACTIVE

## 2015-12-28 LAB — CULTURE, OB URINE
COLONY COUNT: NO GROWTH
ORGANISM ID, BACTERIA: NO GROWTH

## 2015-12-29 ENCOUNTER — Encounter: Payer: Self-pay | Admitting: Cardiology

## 2015-12-30 ENCOUNTER — Other Ambulatory Visit (HOSPITAL_COMMUNITY)
Admission: RE | Admit: 2015-12-30 | Discharge: 2015-12-30 | Disposition: A | Discharge status: Home / Self Care | Payer: Medicaid Other | Source: Ambulatory Visit | Attending: Family Medicine | Admitting: Family Medicine

## 2015-12-30 ENCOUNTER — Ambulatory Visit (INDEPENDENT_AMBULATORY_CARE_PROVIDER_SITE_OTHER): Payer: Medicaid Other | Admitting: Family Medicine

## 2015-12-30 ENCOUNTER — Encounter: Payer: Self-pay | Admitting: Family Medicine

## 2015-12-30 ENCOUNTER — Inpatient Hospital Stay (HOSPITAL_COMMUNITY)
Admission: AD | Admit: 2015-12-30 | Discharge: 2015-12-30 | Discharge status: Against Medical Advice | Payer: Medicaid Other | Source: Ambulatory Visit | Attending: Obstetrics and Gynecology | Admitting: Obstetrics and Gynecology

## 2015-12-30 VITALS — BP 104/64 | HR 79 | Temp 98.6°F | Wt 156.0 lb

## 2015-12-30 DIAGNOSIS — O26899 Other specified pregnancy related conditions, unspecified trimester: Secondary | ICD-10-CM | POA: Diagnosis present

## 2015-12-30 DIAGNOSIS — R109 Unspecified abdominal pain: Secondary | ICD-10-CM | POA: Insufficient documentation

## 2015-12-30 DIAGNOSIS — Z1151 Encounter for screening for human papillomavirus (HPV): Secondary | ICD-10-CM | POA: Diagnosis not present

## 2015-12-30 DIAGNOSIS — Z01419 Encounter for gynecological examination (general) (routine) without abnormal findings: Secondary | ICD-10-CM | POA: Diagnosis present

## 2015-12-30 DIAGNOSIS — Z3401 Encounter for supervision of normal first pregnancy, first trimester: Secondary | ICD-10-CM

## 2015-12-30 DIAGNOSIS — Z113 Encounter for screening for infections with a predominantly sexual mode of transmission: Secondary | ICD-10-CM | POA: Insufficient documentation

## 2015-12-30 LAB — CBC
HCT: 35.2 % — ABNORMAL LOW (ref 36.0–46.0)
Hemoglobin: 11.7 g/dL — ABNORMAL LOW (ref 12.0–15.0)
MCH: 27.5 pg (ref 26.0–34.0)
MCHC: 33.2 g/dL (ref 30.0–36.0)
MCV: 82.6 fL (ref 78.0–100.0)
PLATELETS: 301 10*3/uL (ref 150–400)
RBC: 4.26 MIL/uL (ref 3.87–5.11)
RDW: 13.2 % (ref 11.5–15.5)
WBC: 10.5 10*3/uL (ref 4.0–10.5)

## 2015-12-30 LAB — POCT URINALYSIS DIPSTICK
BILIRUBIN UA: NEGATIVE
Blood, UA: NEGATIVE
Glucose, UA: NEGATIVE
LEUKOCYTES UA: NEGATIVE
Nitrite, UA: NEGATIVE
Protein, UA: 30
Spec Grav, UA: 1.025
Urobilinogen, UA: 1
pH, UA: 6.5

## 2015-12-30 LAB — HCG, QUANTITATIVE, PREGNANCY: hCG, Beta Chain, Quant, S: 164579 m[IU]/mL — ABNORMAL HIGH (ref <5)

## 2015-12-30 LAB — URINALYSIS, ROUTINE W REFLEX MICROSCOPIC
Bilirubin Urine: NEGATIVE
GLUCOSE, UA: NEGATIVE mg/dL
HGB URINE DIPSTICK: NEGATIVE
Ketones, ur: 5 mg/dL — AB
Leukocytes, UA: NEGATIVE
Nitrite: NEGATIVE
PH: 5 (ref 5.0–8.0)
PROTEIN: NEGATIVE mg/dL
Specific Gravity, Urine: 1.021 (ref 1.005–1.030)

## 2015-12-30 LAB — POCT UA - MICROSCOPIC ONLY

## 2015-12-30 LAB — ABO/RH: ABO/RH(D): A POS

## 2015-12-30 NOTE — Patient Instructions (Addendum)
It was a pleasure to see you today! We need you to go to the MAU now for an ultrasound to make sure that pain isn't caused by a pregnancy in the wrong location. Keep taking your prenatal vitamins. Based on your race, we need to have you do a early blood sugar test. I will order it as a future lab and you can come in the next 2 weeks to get it done. The [place] for baby's genetic screening will be done over at [facility] at a later date.

## 2015-12-30 NOTE — MAU Note (Signed)
Pt called for u/s and not in lobby. Told admission personnel she had to go and left after Triage RN had talked with her regarding plan for pt to go to u/s

## 2015-12-30 NOTE — Progress Notes (Signed)
Vaginal pain and discomfort about a 9-10 in the beginning and a steady 7 or 8 now.

## 2015-12-30 NOTE — MAU Note (Addendum)
Spoke with pt regarding delay in getting back to MAU room. Plan to send pt to u/s from lobby and then will get pt back to Triage room as soon as poss. U/S called.

## 2015-12-30 NOTE — Progress Notes (Signed)
INITIAL VISIT:  Maria Combs is a 25 y.o. yo G2P1001 at Unknown who presents for her initial prenatal visit. Vaginal pain x 3 days.  Pregnancy is planned She reports breast tenderness, fatigue, nausea and positive home pregnancy test. She  is taking PNV. See flow sheet for details.  PMH, POBH, FH, meds, allergies and Social Hx reviewed. Prior pregnancy with [facility] - vag term, 8lb 5 oz. No complications at all in this pregnancy.   Prenatal Exam: Gen: Well nourished, well developed.  No distress.  Vitals noted. HEENT: Normocephalic, atraumatic.  Neck supple without cervical lymphadenopathy, thyromegaly or thyroid nodules.  Fair dentition. CV: RRR no murmur, gallops or rubs Lungs: CTAB.  Normal respiratory effort without wheezes or rales. Abd: soft, NTND. +BS.  Uterus not appreciated above pelvis. GU: Normal external female genitalia without lesions.  Normal vaginal, well rugated without lesions. White discharge from cervix.  Bimanual exam: No adnexal mass or TTP. No CMT.  Uterus size below pubic symphysis.  Ext: No clubbing, cyanosis or edema. Psych: Normal grooming and dress.  Not depressed or anxious appearing.  Normal thought content and process without flight of ideas or looseness of associations.  Assessment & Plan: 1) 25 y.o. yo G2P1001 at Unknown via LMP doing well.  Current pregnancy issues include vaginal pain . Dating is reliable. Prenatal labs reviewed, notable for proteinuria, repeated today and still present, sent urine protein Cr ratio for baseline. Genetic screening offered: patient would like this, ordered integrated screen for future. Early glucola is indicated, as patient identifies as Native [employer]. PHQ-9 and Pregnancy Medical Home forms completed and reviewed.  Sent to MAU given history of vaginal pain x 3 days and no proven IUP.   Bleeding and pain precautions reviewed. Importance of prenatal vitamins reviewed.  Follow up in 4 weeks.

## 2015-12-30 NOTE — MAU Note (Signed)
Pt presents to MAU with complaints of lower abdominal pain, Pt was evaluated by [facility] today and was sent to rule out an ectopic pregnancy. Denies any vaginal bleeding

## 2015-12-31 LAB — PROTEIN / CREATININE RATIO, URINE
CREATININE, URINE: 309 mg/dL (ref 20–320)
PROTEIN CREATININE RATIO: 91 mg/g{creat} (ref 21–161)
Total Protein, Urine: 28 mg/dL — ABNORMAL HIGH (ref 5–24)

## 2016-01-02 ENCOUNTER — Telehealth: Payer: Self-pay | Admitting: *Deleted

## 2016-01-02 LAB — CERVICOVAGINAL ANCILLARY ONLY
CHLAMYDIA, DNA PROBE: NEGATIVE
Neisseria Gonorrhea: NEGATIVE

## 2016-01-02 NOTE — Telephone Encounter (Signed)
Spoke with patient and she is aware of appointment. Meegan Shanafelt,CMA

## 2016-01-02 NOTE — Telephone Encounter (Signed)
Tried calling patient on numbers listed but was unable to reach her.  I did leave a message asking her to call back.  I also was able to reach her father and asked him to have her call me back.  Please inform her of her appointment at [facility] hospital for her ultrasound on 02/08/16 at 9:30am.  Thanks Kenard Morawski,CMA

## 2016-01-03 LAB — CYTOLOGY - PAP
Diagnosis: NEGATIVE
HPV (WINDOPATH): NOT DETECTED

## 2016-01-05 ENCOUNTER — Telehealth: Payer: Self-pay | Admitting: *Deleted

## 2016-01-05 ENCOUNTER — Other Ambulatory Visit: Payer: Self-pay | Admitting: Family Medicine

## 2016-01-05 DIAGNOSIS — R102 Pelvic and perineal pain: Secondary | ICD-10-CM

## 2016-01-05 NOTE — Telephone Encounter (Signed)
-----   Message from Leeanne Rio, MD sent at 01/02/2016  1:52 PM EST ----- Needs ultrasound ASAP if she's having abdominal or pelvic pain - this is why we sent her there.  Would advise her to go back to MAU, that's the fastest way to get an ultrasound. If she is unwilling to do this we can get her in for an outpatient ultrasound to confirm viability/intrauterine pregnancy. Either way needs to be done ASAP.  Integrated screen should be scheduled over the phone with the MFM dept, I do not think MAU has any part in scheduling these.  Leeanne Rio, MD   ----- Message ----- From: Sela Hilding, MD Sent: 01/02/2016   9:36 AM To: Leeanne Rio, MD, [facility]  I sent this patient to the MAU on Friday afternoon to get an Korea to confirm an intra-uterine (rather than ectopic) pregnancy. She left before getting the Korea. She was going to also ask to schedule her early integrated screen while there at Jazmin's recommendation. Dr. Ardelia Mems, should we call and have the patient scheduled for an Korea today to confirm IUP? Send her back to the MAU?

## 2016-01-05 NOTE — Telephone Encounter (Signed)
Checking with Dr. Lindell Noe to see if there is anything we need to do with this.  Did not see any recent communication or orders for this, so wanted to check and see what we should do to take care of this. Katharina Caper, Lavonn Maxcy D, [place]

## 2016-01-05 NOTE — Telephone Encounter (Signed)
I am on nights, but I would love if someone can get in touch with her. I will place an order for outpatient ultrasound as well.

## 2016-01-06 NOTE — Progress Notes (Signed)
Scheduled for 01/11/16 @8am  at [place], April D, [place]

## 2016-01-06 NOTE — Telephone Encounter (Signed)
Contacted pt and did confirm she wanted me to schedule and appointment for this Korea and it is scheduled for 01/11/2016 @8am  at the [place] pt that if she has pain over the weekend she should go back to the MAU and they could get this done sooner. Pt understood.  Katharina Caper, April D, [place]

## 2016-01-11 ENCOUNTER — Ambulatory Visit (HOSPITAL_COMMUNITY)
Admission: RE | Admit: 2016-01-11 | Discharge: 2016-01-11 | Disposition: A | Discharge status: Home / Self Care | Payer: Medicaid Other | Source: Ambulatory Visit | Attending: Family Medicine | Admitting: Family Medicine

## 2016-01-11 ENCOUNTER — Telehealth: Payer: Self-pay | Admitting: *Deleted

## 2016-01-11 DIAGNOSIS — R102 Pelvic and perineal pain: Secondary | ICD-10-CM | POA: Diagnosis not present

## 2016-01-11 DIAGNOSIS — O26891 Other specified pregnancy related conditions, first trimester: Secondary | ICD-10-CM | POA: Diagnosis not present

## 2016-01-11 DIAGNOSIS — Z3A09 9 weeks gestation of pregnancy: Secondary | ICD-10-CM | POA: Diagnosis not present

## 2016-01-11 NOTE — Telephone Encounter (Signed)
Pt calling in wanting to know the results of her ultrasound. Please advise. [name], CMA

## 2016-01-11 NOTE — Telephone Encounter (Signed)
Called patient on listed number, called father and got updated number at [phone]. If patient calls back, please make sure this the best number for her. If she calls back, you are welcome to tell her that the ultrasound looked reassuring - the baby is in the uterus where it is supposed to be. Thanks

## 2016-01-13 ENCOUNTER — Ambulatory Visit: Payer: Medicaid Other | Admitting: Cardiology

## 2016-01-16 NOTE — L&D Delivery Note (Signed)
26 y.o. G2P1001 at [redacted]w[redacted]d admitted for SROM delivered a viable female infant at 3734 in cephalic, OA position. Loose nuchal cord, easily reduced at the perineum. Left anterior shoulder delivered with ease. 60 sec delayed cord clamping. Cord clamped x2 and cut. Placenta delivered spontaneously intact, with 3VC. Fundus firm on exam with massage and pitocin. Good hemostasis noted.  Anesthesia: Epidural Laceration: none Good hemostasis noted. EBL: 50cc  Mom and baby recovering in LDR.    Apgars: APGAR (1 MIN): 8   APGAR (5 MINS): 9   Weight: Pending skin to skin  Sponge and instrument count were correct x2. Placenta sent to L&D. Boy, Breast feeding, Depo for contraception.  [place] Antwann Preziosi, DO FM Resident PGY-1 08/08/2016 3:40 PM

## 2016-01-17 ENCOUNTER — Encounter: Payer: Self-pay | Admitting: Cardiology

## 2016-01-19 NOTE — Telephone Encounter (Signed)
Spoke with patient and gave her the results of ultrasound she expressed understanding. She did confirm that the best number to reach her at is the one that we already have on file. Nat Christen, CMA

## 2016-01-31 ENCOUNTER — Encounter (HOSPITAL_COMMUNITY): Payer: Self-pay | Admitting: Family Medicine

## 2016-02-08 ENCOUNTER — Encounter (HOSPITAL_COMMUNITY): Payer: Self-pay

## 2016-02-08 ENCOUNTER — Ambulatory Visit (HOSPITAL_COMMUNITY)
Admission: RE | Admit: 2016-02-08 | Discharge: 2016-02-08 | Disposition: A | Discharge status: Home / Self Care | Payer: Medicaid Other | Source: Ambulatory Visit | Attending: Family Medicine | Admitting: Family Medicine

## 2016-02-08 ENCOUNTER — Other Ambulatory Visit: Payer: Self-pay | Admitting: Family Medicine

## 2016-02-08 DIAGNOSIS — Z3A13 13 weeks gestation of pregnancy: Secondary | ICD-10-CM | POA: Insufficient documentation

## 2016-02-08 DIAGNOSIS — Z363 Encounter for antenatal screening for malformations: Secondary | ICD-10-CM | POA: Insufficient documentation

## 2016-02-08 DIAGNOSIS — Z3401 Encounter for supervision of normal first pregnancy, first trimester: Secondary | ICD-10-CM

## 2016-02-12 ENCOUNTER — Other Ambulatory Visit: Payer: Self-pay

## 2016-02-13 ENCOUNTER — Other Ambulatory Visit (HOSPITAL_COMMUNITY): Payer: Self-pay

## 2016-03-07 ENCOUNTER — Telehealth: Payer: Self-pay

## 2016-03-07 NOTE — Telephone Encounter (Signed)
Would like Dr Lindell Noe to give her a call about labs from 02/08/2016. Please call [phone]. Ottis Stain, CMA

## 2016-03-07 NOTE — Telephone Encounter (Signed)
Called patient back, she was questioning the first trimester nuchal translucency screen, which was normal. She also noted that she has been having some intermittent mild pain in her left ankle and left wrist and some now on the right as well. Denies itchiness denies any additional symptoms. Offered same-day appointment tomorrow however we do not have any, made same day appointment Friday with Dr. Dallas Schimke at [phone]. Given return precautions to go to the MAU of any of this worsens.

## 2016-03-08 NOTE — Progress Notes (Signed)
   [facility] Phone: [phone]   Date of Visit: 03/09/2016   HPI:  Maria Combs is a 26 y.o. female presenting to clinic today for same day appointment. PCP: Ralene Ok, MD Concerns today include:  Ankle/Wrist Pain:  - 1.5 weeks ago patient started to have pain in her Left thigh that radiated to her left ankle. A few days later, both of her ankles started to hurt. Then a few days later, her wrists also started to hurt. Reports that sometimes when she flexes her wrists, she feels tension and pain.   - pain is mainly at night and sometimes during the day. Pain is intermittent. Pain can last a full night.  - tries to increase water intake which did not make a difference - also reports of intermittent hip numbness - no specific aggravating/alleviating factors - no joint swelling or redness - no rashes, fevers, or chills - no recent camping or going to wooded areas - no injury/trauma; no recent overuse - has tried Tylenol which does not help as much - no family history of rheumatologic disorders to patient's knowledge   ROS: See HPI.  [place]:  Second Trimester Pregnancy  Asthma HA disorder   PHYSICAL EXAM: BP (!) 90/50 (BP Location: Right Arm, Patient Position: Sitting, Cuff Size: Normal)   Pulse 94   Temp 98.3 F (36.8 C) (Oral)   Ht 5\' 6"  (1.676 m)   Wt 150 lb 6.4 oz (68.2 kg)   LMP 11/09/2015 (Exact Date)   BMI 24.28 kg/m  GEN: NAD CV: RRR, no murmurs, rubs, or gallops PULM: CTAB, normal effort ABD: Gravid, Soft, nontender, nondistended, NABS, no organomegaly. Fundal height 17cm.  MSK: Wrists: no swelling or erythema. No tenderness to palpation. Normal ROM. Negative tinel and Phalen tests.  Lower extremity: normal ROM of hips bilaterally with out pain. Knees- normal without effusion/swelling, erythema or tenderness to palpation. Ankles: no effusion/swelling, no erythema, no tenderness, normal ROM. Lower extremity strength- 5/5  bilaterally. Normal sensation to light touch.  SKIN: No rash or cyanosis; warm and well-perfused EXTR: No lower extremity edema or calf tenderness PSYCH: Mood and affect euthymic, normal rate and volume of speech NEURO: Awake, alert, no focal deficits grossly, normal speech  FHR 150bpm   ASSESSMENT/PLAN:  1. Arthralgia of multiple joints Unclear in etiology from history and physical exam. No joint effusion or tenderness to palpation on exam. Unable to reproduce symptoms with tinel and phalen tests to support carpel tunnel as an etiology for wrist pain. Will try cock up splints for wrists to wear at night. Tylenol PRN for pain. Will also obtain basic lab work to evaluate for a possible rheumatologic cause.  - Sedimentation Rate; Future - C-reactive protein; Future - ANA; Future - Rheumatoid factor; Future  Discussed with Preceptor  FOLLOW UP: Follow up for OB visit as soon as possible  Smiley Houseman, MD PGY [place]

## 2016-03-09 ENCOUNTER — Encounter: Payer: Self-pay | Admitting: Internal Medicine

## 2016-03-09 ENCOUNTER — Ambulatory Visit (INDEPENDENT_AMBULATORY_CARE_PROVIDER_SITE_OTHER): Payer: Medicaid Other | Admitting: Internal Medicine

## 2016-03-09 VITALS — BP 90/50 | HR 94 | Temp 98.3°F | Ht 66.0 in | Wt 150.4 lb

## 2016-03-09 DIAGNOSIS — M2550 Pain in unspecified joint: Secondary | ICD-10-CM

## 2016-03-09 NOTE — Patient Instructions (Addendum)
Lets get labs to further evaluate joint pain Try the wrist splints at night Make an OB visit as soon as possible

## 2016-03-13 ENCOUNTER — Ambulatory Visit (INDEPENDENT_AMBULATORY_CARE_PROVIDER_SITE_OTHER): Payer: Medicaid Other | Admitting: Family Medicine

## 2016-03-13 VITALS — BP 92/62 | HR 90 | Temp 98.5°F | Wt 149.2 lb

## 2016-03-13 DIAGNOSIS — Z3482 Encounter for supervision of other normal pregnancy, second trimester: Secondary | ICD-10-CM

## 2016-03-13 DIAGNOSIS — M2550 Pain in unspecified joint: Secondary | ICD-10-CM

## 2016-03-13 NOTE — Progress Notes (Signed)
   CC: routine OB  14-18 WEEKS  Maria Combs is a 26 y.o. G2P1001 at [redacted]w[redacted]d here for routine follow up.  She reports no complaints. See flow sheet for details.  BP 92/62   Pulse 90   Temp 98.5 F (36.9 C)   Wt 149 lb 3.2 oz (67.7 kg)   LMP 11/09/2015 (Exact Date)   BMI 24.08 kg/m   A/P: Pregnancy at [redacted]w[redacted]d.  Doing well. Would like to have gender placed in a box at anatomy scan.   Pregnancy issues include low weight gain. Patient reports that she is eating well, although partner is in the room and denies this. Encouraged patient to eat high fat and protein heavy meals, such as avocado, and meat, olive oil. Will closely follow weight. Anatomy ultrasound ordered and scheduled today. Patient is not interested in additional genetic screening. Nuchal translucency screen normal.  Bleeding and pain precautions reviewed. Follow up 4 weeks.   Orders Placed This Encounter  Procedures  . [place] MFM OB COMP + 14 WK    Patient is [redacted]w[redacted]d today 2/27. Would like anatomy scan between 2/28 and 3/13. Patient prefers afternoon.    Standing Status:   Future    Standing Expiration Date:   05/12/2016    Order Specific Question:   Reason for Exam (SYMPTOM  OR DIAGNOSIS REQUIRED)    Answer:   Anatomy scan    Order Specific Question:   Preferred imaging location?    Answer:   [facility]    Ralene Ok, MD, PGY1 03/13/2016 3:43 PM

## 2016-03-13 NOTE — Patient Instructions (Addendum)
Go to the MAU if you have vaginal bleeding, contractions, or lots of discharge. Let the ultrasound tech know that you don't want to know the gender.    Second Trimester of Pregnancy The second trimester is from week 13 through week 28, month 4 through 6. This is often the time in pregnancy that you feel your best. Often times, morning sickness has lessened or quit. You may have more energy, and you may get hungry more often. Your unborn baby (fetus) is growing rapidly. At the end of the sixth month, he or she is about 9 inches long and weighs about 1 pounds. You will likely feel the baby move (quickening) between 18 and 20 weeks of pregnancy. Follow these instructions at home:  Avoid all smoking, herbs, and alcohol. Avoid drugs not approved by your doctor.  Do not use any tobacco products, including cigarettes, chewing tobacco, and electronic cigarettes. If you need help quitting, ask your doctor. You may get counseling or other support to help you quit.  Only take medicine as told by your doctor. Some medicines are safe and some are not during pregnancy.  Exercise only as told by your doctor. Stop exercising if you start having cramps.  Eat regular, healthy meals.  Wear a good support bra if your breasts are tender.  Do not use hot tubs, steam rooms, or saunas.  Wear your seat belt when driving.  Avoid raw meat, uncooked cheese, and liter boxes and soil used by cats.  Take your prenatal vitamins.  Take 1500-2000 milligrams of calcium daily starting at the 20th week of pregnancy until you deliver your baby.  Try taking medicine that helps you poop (stool softener) as needed, and if your doctor approves. Eat more fiber by eating fresh fruit, vegetables, and whole grains. Drink enough fluids to keep your pee (urine) clear or pale yellow.  Take warm water baths (sitz baths) to soothe pain or discomfort caused by hemorrhoids. Use hemorrhoid cream if your doctor approves.  If you have  puffy, bulging veins (varicose veins), wear support hose. Raise (elevate) your feet for 15 minutes, 3-4 times a day. Limit salt in your diet.  Avoid heavy lifting, wear low heals, and sit up straight.  Rest with your legs raised if you have leg cramps or low back pain.  Visit your dentist if you have not gone during your pregnancy. Use a soft toothbrush to brush your teeth. Be gentle when you floss.  You can have sex (intercourse) unless your doctor tells you not to.  Go to your doctor visits. Get help if:  You feel dizzy.  You have mild cramps or pressure in your lower belly (abdomen).  You have a nagging pain in your belly area.  You continue to feel sick to your stomach (nauseous), throw up (vomit), or have watery poop (diarrhea).  You have bad smelling fluid coming from your vagina.  You have pain with peeing (urination). Get help right away if:  You have a fever.  You are leaking fluid from your vagina.  You have spotting or bleeding from your vagina.  You have severe belly cramping or pain.  You lose or gain weight rapidly.  You have trouble catching your breath and have chest pain.  You notice sudden or extreme puffiness (swelling) of your face, hands, ankles, feet, or legs.  You have not felt the baby move in over an hour.  You have severe headaches that do not go away with medicine.  You have  vision changes. This information is not intended to replace advice given to you by your health care provider. Make sure you discuss any questions you have with your health care provider. Document Released: 03/28/2009 Document Revised: 06/09/2015 Document Reviewed: 03/04/2012 Elsevier Interactive Patient Education  2017 [employer]  

## 2016-03-14 LAB — ANA: Anti Nuclear Antibody(ANA): NEGATIVE

## 2016-03-14 LAB — SEDIMENTATION RATE: SED RATE: 18 mm/h (ref 0–20)

## 2016-03-14 LAB — C-REACTIVE PROTEIN: CRP: 4.7 mg/L (ref <8.0)

## 2016-03-14 LAB — RHEUMATOID FACTOR: Rhuematoid fact SerPl-aCnc: <14 IU/mL (ref <14)

## 2016-03-15 ENCOUNTER — Telehealth: Payer: Self-pay | Admitting: Internal Medicine

## 2016-03-15 NOTE — Telephone Encounter (Signed)
LM for patient to call back to office. Noralyn Karim,CMA  

## 2016-03-15 NOTE — Telephone Encounter (Signed)
Please call patient to report that the blood work done to evaluate her pain were all normal and therefore unlikely this caused by a rheumatologic disease. She can continue to take Tylenol as needed and should follow up with her OB.

## 2016-03-20 ENCOUNTER — Ambulatory Visit (HOSPITAL_COMMUNITY)
Admission: RE | Admit: 2016-03-20 | Discharge: 2016-03-20 | Disposition: A | Discharge status: Home / Self Care | Payer: Medicaid Other | Source: Ambulatory Visit | Attending: Family Medicine | Admitting: Family Medicine

## 2016-03-20 DIAGNOSIS — Z3A18 18 weeks gestation of pregnancy: Secondary | ICD-10-CM | POA: Diagnosis not present

## 2016-03-20 DIAGNOSIS — Z363 Encounter for antenatal screening for malformations: Secondary | ICD-10-CM | POA: Insufficient documentation

## 2016-03-20 DIAGNOSIS — Z3482 Encounter for supervision of other normal pregnancy, second trimester: Secondary | ICD-10-CM

## 2016-04-12 ENCOUNTER — Ambulatory Visit (INDEPENDENT_AMBULATORY_CARE_PROVIDER_SITE_OTHER): Payer: Medicaid Other | Admitting: Family Medicine

## 2016-04-12 VITALS — BP 98/58 | HR 94 | Temp 98.9°F | Wt 160.0 lb

## 2016-04-12 DIAGNOSIS — Z3A22 22 weeks gestation of pregnancy: Secondary | ICD-10-CM

## 2016-04-12 NOTE — Patient Instructions (Signed)
Follow up with Dr.Timberlake in 4 weeks  Second Trimester of Pregnancy The second trimester is from week 14 through week 27 (months 4 through 6). The second trimester is often a time when you feel your best. Your body has adjusted to being pregnant, and you begin to feel better physically. Usually, morning sickness has lessened or quit completely, you may have more energy, and you may have an increase in appetite. The second trimester is also a time when the fetus is growing rapidly. At the end of the sixth month, the fetus is about 9 inches long and weighs about 1 pounds. You will likely begin to feel the baby move (quickening) between 16 and 20 weeks of pregnancy. Body changes during your second trimester Your body continues to go through many changes during your second trimester. The changes vary from woman to woman.  Your weight will continue to increase. You will notice your lower abdomen bulging out.  You may begin to get stretch marks on your hips, abdomen, and breasts.  You may develop headaches that can be relieved by medicines. The medicines should be approved by your health care provider.  You may urinate more often because the fetus is pressing on your bladder.  You may develop or continue to have heartburn as a result of your pregnancy.  You may develop constipation because certain hormones are causing the muscles that push waste through your intestines to slow down.  You may develop hemorrhoids or swollen, bulging veins (varicose veins).  You may have back pain. This is caused by:  Weight gain.  Pregnancy hormones that are relaxing the joints in your pelvis.  A shift in weight and the muscles that support your balance.  Your breasts will continue to grow and they will continue to become tender.  Your gums may bleed and may be sensitive to brushing and flossing.  Dark spots or blotches (chloasma, mask of pregnancy) may develop on your face. This will likely fade after  the baby is born.  A dark line from your belly button to the pubic area (linea nigra) may appear. This will likely fade after the baby is born.  You may have changes in your hair. These can include thickening of your hair, rapid growth, and changes in texture. Some women also have hair loss during or after pregnancy, or hair that feels dry or thin. Your hair will most likely return to normal after your baby is born. What to expect at prenatal visits During a routine prenatal visit:  You will be weighed to make sure you and the fetus are growing normally.  Your blood pressure will be taken.  Your abdomen will be measured to track your baby's growth.  The fetal heartbeat will be listened to.  Any test results from the previous visit will be discussed. Your health care provider may ask you:  How you are feeling.  If you are feeling the baby move.  If you have had any abnormal symptoms, such as leaking fluid, bleeding, severe headaches, or abdominal cramping.  If you are using any tobacco products, including cigarettes, chewing tobacco, and electronic cigarettes.  If you have any questions. Other tests that may be performed during your second trimester include:  Blood tests that check for:  Low iron levels (anemia).  High blood sugar that affects pregnant women (gestational diabetes) between 44 and 28 weeks.  Rh antibodies. This is to check for a protein on red blood cells (Rh factor).  Urine tests to  check for infections, diabetes, or protein in the urine.  An ultrasound to confirm the proper growth and development of the baby.  An amniocentesis to check for possible genetic problems.  Fetal screens for spina bifida and Down syndrome.  HIV (human immunodeficiency virus) testing. Routine prenatal testing includes screening for HIV, unless you choose not to have this test. Follow these instructions at home: Medicines   Follow your health care provider's instructions  regarding medicine use. Specific medicines may be either safe or unsafe to take during pregnancy.  Take a prenatal vitamin that contains at least 600 micrograms (mcg) of folic acid.  If you develop constipation, try taking a stool softener if your health care provider approves. Eating and drinking   Eat a balanced diet that includes fresh fruits and vegetables, whole grains, good sources of protein such as meat, eggs, or tofu, and low-fat dairy. Your health care provider will help you determine the amount of weight gain that is right for you.  Avoid raw meat and uncooked cheese. These carry germs that can cause birth defects in the baby.  If you have low calcium intake from food, talk to your health care provider about whether you should take a daily calcium supplement.  Limit foods that are high in fat and processed sugars, such as fried and sweet foods.  To prevent constipation:  Drink enough fluid to keep your urine clear or pale yellow.  Eat foods that are high in fiber, such as fresh fruits and vegetables, whole grains, and beans. Activity   Exercise only as directed by your health care provider. Most women can continue their usual exercise routine during pregnancy. Try to exercise for 30 minutes at least 5 days a week. Stop exercising if you experience uterine contractions.  Avoid heavy lifting, wear low heel shoes, and practice good posture.  A sexual relationship may be continued unless your health care provider directs you otherwise. Relieving pain and discomfort   Wear a good support bra to prevent discomfort from breast tenderness.  Take warm sitz baths to soothe any pain or discomfort caused by hemorrhoids. Use hemorrhoid cream if your health care provider approves.  Rest with your legs elevated if you have leg cramps or low back pain.  If you develop varicose veins, wear support hose. Elevate your feet for 15 minutes, 3-4 times a day. Limit salt in your diet. Prenatal  Care   Write down your questions. Take them to your prenatal visits.  Keep all your prenatal visits as told by your health care provider. This is important. Safety   Wear your seat belt at all times when driving.  Make a list of emergency phone numbers, including numbers for family, friends, the hospital, and police and fire departments. General instructions   Ask your health care provider for a referral to a local prenatal education class. Begin classes no later than the beginning of month 6 of your pregnancy.  Ask for help if you have counseling or nutritional needs during pregnancy. Your health care provider can offer advice or refer you to specialists for help with various needs.  Do not use hot tubs, steam rooms, or saunas.  Do not douche or use tampons or scented sanitary pads.  Do not cross your legs for long periods of time.  Avoid cat litter boxes and soil used by cats. These carry germs that can cause birth defects in the baby and possibly loss of the fetus by miscarriage or stillbirth.  Avoid all  smoking, herbs, alcohol, and unprescribed drugs. Chemicals in these products can affect the formation and growth of the baby.  Do not use any products that contain nicotine or tobacco, such as cigarettes and e-cigarettes. If you need help quitting, ask your health care provider.  Visit your dentist if you have not gone yet during your pregnancy. Use a soft toothbrush to brush your teeth and be gentle when you floss. Contact a health care provider if:  You have dizziness.  You have mild pelvic cramps, pelvic pressure, or nagging pain in the abdominal area.  You have persistent nausea, vomiting, or diarrhea.  You have a bad smelling vaginal discharge.  You have pain when you urinate. Get help right away if:  You have a fever.  You are leaking fluid from your vagina.  You have spotting or bleeding from your vagina.  You have severe abdominal cramping or pain.  You  have rapid weight gain or weight loss.  You have shortness of breath with chest pain.  You notice sudden or extreme swelling of your face, hands, ankles, feet, or legs.  You have not felt your baby move in over an hour.  You have severe headaches that do not go away when you take medicine.  You have vision changes. Summary  The second trimester is from week 14 through week 27 (months 4 through 6). It is also a time when the fetus is growing rapidly.  Your body goes through many changes during pregnancy. The changes vary from woman to woman.  Avoid all smoking, herbs, alcohol, and unprescribed drugs. These chemicals affect the formation and growth your baby.  Do not use any tobacco products, such as cigarettes, chewing tobacco, and e-cigarettes. If you need help quitting, ask your health care provider.  Contact your health care provider if you have any questions. Keep all prenatal visits as told by your health care provider. This is important. This information is not intended to replace advice given to you by your health care provider. Make sure you discuss any questions you have with your health care provider. Document Released: 12/26/2000 Document Revised: 06/09/2015 Document Reviewed: 03/04/2012 Elsevier Interactive Patient Education  2017 [employer].

## 2016-04-12 NOTE — Progress Notes (Signed)
Kyrstyn L Kotter is a 27 y.o. G2P1001 at [redacted]w[redacted]d here for routine follow up.  She reports good fetal movement, infrequent mild contractions no vaginal bleeding or discharge.  See flow sheet for details.  A/P: Pregnancy at [redacted]w[redacted]d.  Doing well.   Pregnancy issues include UTD on left kidney on anatomy US. Follow [place] after 28 weeks. Anatomy scan reviewed, problems are noted ( see above).  Preterm labor precautions reviewed. Follow up 4 weeks with Dr.Timberlake

## 2016-04-18 ENCOUNTER — Telehealth: Payer: Self-pay | Admitting: Family Medicine

## 2016-04-18 NOTE — Telephone Encounter (Signed)
Patient came to office needing a letter stating she is pregnant. This is for housing. Please call patent to let her know when she may pick this up. [phone]. This is needed as soon as possible

## 2016-04-20 ENCOUNTER — Encounter: Payer: Self-pay | Admitting: Family Medicine

## 2016-04-20 NOTE — Telephone Encounter (Signed)
Letter printed and signed. Will leave up front, please call and let her know this is ready.

## 2016-04-20 NOTE — Telephone Encounter (Signed)
LVM for pt to call. Please inform pt of information below. Ottis Stain, CMA

## 2016-05-14 ENCOUNTER — Telehealth: Payer: Self-pay | Admitting: Family Medicine

## 2016-05-14 ENCOUNTER — Ambulatory Visit (INDEPENDENT_AMBULATORY_CARE_PROVIDER_SITE_OTHER): Payer: Medicaid Other | Admitting: Family Medicine

## 2016-05-14 VITALS — BP 98/52 | HR 88 | Temp 99.0°F | Wt 166.6 lb

## 2016-05-14 DIAGNOSIS — O35EXX0 Maternal care for other (suspected) fetal abnormality and damage, fetal genitourinary anomalies, not applicable or unspecified: Secondary | ICD-10-CM

## 2016-05-14 DIAGNOSIS — Z3492 Encounter for supervision of normal pregnancy, unspecified, second trimester: Secondary | ICD-10-CM | POA: Diagnosis not present

## 2016-05-14 DIAGNOSIS — O358XX0 Maternal care for other (suspected) fetal abnormality and damage, not applicable or unspecified: Secondary | ICD-10-CM

## 2016-05-14 LAB — POCT 1 HR PRENATAL GLUCOSE: GLUCOSE 1 HR PRENATAL, POC: 72 mg/dL

## 2016-05-14 NOTE — Progress Notes (Signed)
Jonah L Basara is a 26 y.o. G2P1001 at [redacted]w[redacted]d here for routine follow up.  She reports inguinal crease itching. See flow sheet for details.  A/P: Pregnancy at [redacted]w[redacted]d.  Doing well.   Pregnancy issues include fetal pyelectasis on anatomy scan, repeat US scheduled today. Patient desires Depo as birth control and breastfeeding. Tdap was not given today. 1 hour glucola normal, CBC, RPR, and HIV were done today.   Pregnancy medical home and PHQ-9 forms were done today and reviewed.   Rh status was reviewed and patient does not need Rhogam.  Rhogam was not given today.  Preterm labor and fetal movement precautions reviewed. Scheduled for first available OB clinic as patient did not get to [facility] clinic during second trimester.  On exam, rash on bilateral inguinal creases consistent with excoriations likely secondary to shaving and moisture. Minimal concern for infectious etiology. Counseled patient to avoid moisture and using a drying powder such as corn starch.

## 2016-05-14 NOTE — Telephone Encounter (Signed)
Scheduled U/S for 05/29/2016 @ 12:45 [facility], patient aware

## 2016-05-14 NOTE — Patient Instructions (Signed)
It was a pleasure to see you today! Thank you for choosing [facility] for your primary care. Maria Combs was seen for routine OB.   Our plans for today were:  Continue to try to eat what you can  Go to the MAU if you have vaginal bleeding, leakage of fluid, contractions that don't go away, or not feeling baby move  For your rash, try to keep the area dry - you can use cornstarch as a drying powder. Let the area air dry after showers.   You should come back to the [facility] on 5/31 at 9am. I will call you if we have any concerns with your labs.  Best,  Dr. Lindell Noe   Third Trimester of Pregnancy The third trimester is from week 28 through week 40 (months 7 through 9). The third trimester is a time when the unborn baby (fetus) is growing rapidly. At the end of the ninth month, the fetus is about 20 inches in length and weighs 6-10 pounds. Body changes during your third trimester Your body will continue to go through many changes during pregnancy. The changes vary from woman to woman. During the third trimester:  Your weight will continue to increase. You can expect to gain 25-35 pounds (11-16 kg) by the end of the pregnancy.  You may begin to get stretch marks on your hips, abdomen, and breasts.  You may urinate more often because the fetus is moving lower into your pelvis and pressing on your bladder.  You may develop or continue to have heartburn. This is caused by increased hormones that slow down muscles in the digestive tract.  You may develop or continue to have constipation because increased hormones slow digestion and cause the muscles that push waste through your intestines to relax.  You may develop hemorrhoids. These are swollen veins (varicose veins) in the rectum that can itch or be painful.  You may develop swollen, bulging veins (varicose veins) in your legs.  You may have increased body aches in the pelvis, back, or thighs. This is due to weight  gain and increased hormones that are relaxing your joints.  You may have changes in your hair. These can include thickening of your hair, rapid growth, and changes in texture. Some women also have hair loss during or after pregnancy, or hair that feels dry or thin. Your hair will most likely return to normal after your baby is born.  Your breasts will continue to grow and they will continue to become tender. A yellow fluid (colostrum) may leak from your breasts. This is the first milk you are producing for your baby.  Your belly button may stick out.  You may notice more swelling in your hands, face, or ankles.  You may have increased tingling or numbness in your hands, arms, and legs. The skin on your belly may also feel numb.  You may feel short of breath because of your expanding uterus.  You may have more problems sleeping. This can be caused by the size of your belly, increased need to urinate, and an increase in your body's metabolism.  You may notice the fetus "dropping," or moving lower in your abdomen (lightening).  You may have increased vaginal discharge.  You may notice your joints feel loose and you may have pain around your pelvic bone. What to expect at prenatal visits You will have prenatal exams every 2 weeks until week 36. Then you will have weekly prenatal exams. During a  routine prenatal visit:  You will be weighed to make sure you and the baby are growing normally.  Your blood pressure will be taken.  Your abdomen will be measured to track your baby's growth.  The fetal heartbeat will be listened to.  Any test results from the previous visit will be discussed.  You may have a cervical check near your due date to see if your cervix has softened or thinned (effaced).  You will be tested for Group B streptococcus. This happens between 35 and 37 weeks. Your health care provider may ask you:  What your birth plan is.  How you are feeling.  If you are feeling  the baby move.  If you have had any abnormal symptoms, such as leaking fluid, bleeding, severe headaches, or abdominal cramping.  If you are using any tobacco products, including cigarettes, chewing tobacco, and electronic cigarettes.  If you have any questions. Other tests or screenings that may be performed during your third trimester include:  Blood tests that check for low iron levels (anemia).  Fetal testing to check the health, activity level, and growth of the fetus. Testing is done if you have certain medical conditions or if there are problems during the pregnancy.  Nonstress test (NST). This test checks the health of your baby to make sure there are no signs of problems, such as the baby not getting enough oxygen. During this test, a belt is placed around your belly. The baby is made to move, and its heart rate is monitored during movement. What is false labor? False labor is a condition in which you feel small, irregular tightenings of the muscles in the womb (contractions) that usually go away with rest, changing position, or drinking water. These are called Braxton Hicks contractions. Contractions may last for hours, days, or even weeks before true labor sets in. If contractions come at regular intervals, become more frequent, increase in intensity, or become painful, you should see your health care provider. What are the signs of labor?  Abdominal cramps.  Regular contractions that start at 10 minutes apart and become stronger and more frequent with time.  Contractions that start on the top of the uterus and spread down to the lower abdomen and back.  Increased pelvic pressure and dull back pain.  A watery or bloody mucus discharge that comes from the vagina.  Leaking of amniotic fluid. This is also known as your "water breaking." It could be a slow trickle or a gush. Let your health care provider know if it has a color or strange odor. If you have any of these signs, call  your health care provider right away, even if it is before your due date. Follow these instructions at home: Medicines   Follow your health care provider's instructions regarding medicine use. Specific medicines may be either safe or unsafe to take during pregnancy.  Take a prenatal vitamin that contains at least 600 micrograms (mcg) of folic acid.  If you develop constipation, try taking a stool softener if your health care provider approves. Eating and drinking   Eat a balanced diet that includes fresh fruits and vegetables, whole grains, good sources of protein such as meat, eggs, or tofu, and low-fat dairy. Your health care provider will help you determine the amount of weight gain that is right for you.  Avoid raw meat and uncooked cheese. These carry germs that can cause birth defects in the baby.  If you have low calcium intake from food,  talk to your health care provider about whether you should take a daily calcium supplement.  Eat four or five small meals rather than three large meals a day.  Limit foods that are high in fat and processed sugars, such as fried and sweet foods.  To prevent constipation:  Drink enough fluid to keep your urine clear or pale yellow.  Eat foods that are high in fiber, such as fresh fruits and vegetables, whole grains, and beans. Activity   Exercise only as directed by your health care provider. Most women can continue their usual exercise routine during pregnancy. Try to exercise for 30 minutes at least 5 days a week. Stop exercising if you experience uterine contractions.  Avoid heavy lifting.  Do not exercise in extreme heat or humidity, or at high altitudes.  Wear low-heel, comfortable shoes.  Practice good posture.  You may continue to have sex unless your health care provider tells you otherwise. Relieving pain and discomfort   Take frequent breaks and rest with your legs elevated if you have leg cramps or low back pain.  Take  warm sitz baths to soothe any pain or discomfort caused by hemorrhoids. Use hemorrhoid cream if your health care provider approves.  Wear a good support bra to prevent discomfort from breast tenderness.  If you develop varicose veins:  Wear support pantyhose or compression stockings as told by your healthcare provider.  Elevate your feet for 15 minutes, 3-4 times a day. Prenatal care   Write down your questions. Take them to your prenatal visits.  Keep all your prenatal visits as told by your health care provider. This is important. Safety   Wear your seat belt at all times when driving.  Make a list of emergency phone numbers, including numbers for family, friends, the hospital, and police and fire departments. General instructions   Avoid cat litter boxes and soil used by cats. These carry germs that can cause birth defects in the baby. If you have a cat, ask someone to clean the litter box for you.  Do not travel far distances unless it is absolutely necessary and only with the approval of your health care provider.  Do not use hot tubs, steam rooms, or saunas.  Do not drink alcohol.  Do not use any products that contain nicotine or tobacco, such as cigarettes and e-cigarettes. If you need help quitting, ask your health care provider.  Do not use any medicinal herbs or unprescribed drugs. These chemicals affect the formation and growth of the baby.  Do not douche or use tampons or scented sanitary pads.  Do not cross your legs for long periods of time.  To prepare for the arrival of your baby:  Take prenatal classes to understand, practice, and ask questions about labor and delivery.  Make a trial run to the hospital.  Visit the hospital and tour the maternity area.  Arrange for maternity or paternity leave through employers.  Arrange for family and friends to take care of pets while you are in the hospital.  Purchase a rear-facing car seat and make sure you know  how to install it in your car.  Pack your hospital bag.  Prepare the baby's nursery. Make sure to remove all pillows and stuffed animals from the baby's crib to prevent suffocation.  Visit your dentist if you have not gone during your pregnancy. Use a soft toothbrush to brush your teeth and be gentle when you floss. Contact a health care provider if:  You are unsure if you are in labor or if your water has broken.  You become dizzy.  You have mild pelvic cramps, pelvic pressure, or nagging pain in your abdominal area.  You have lower back pain.  You have persistent nausea, vomiting, or diarrhea.  You have an unusual or bad smelling vaginal discharge.  You have pain when you urinate. Get help right away if:  Your water breaks before 37 weeks.  You have regular contractions less than 5 minutes apart before 37 weeks.  You have a fever.  You are leaking fluid from your vagina.  You have spotting or bleeding from your vagina.  You have severe abdominal pain or cramping.  You have rapid weight loss or weight gain.  You have shortness of breath with chest pain.  You notice sudden or extreme swelling of your face, hands, ankles, feet, or legs.  Your baby makes fewer than 10 movements in 2 hours.  You have severe headaches that do not go away when you take medicine.  You have vision changes. Summary  The third trimester is from week 28 through week 40, months 7 through 9. The third trimester is a time when the unborn baby (fetus) is growing rapidly.  During the third trimester, your discomfort may increase as you and your baby continue to gain weight. You may have abdominal, leg, and back pain, sleeping problems, and an increased need to urinate.  During the third trimester your breasts will keep growing and they will continue to become tender. A yellow fluid (colostrum) may leak from your breasts. This is the first milk you are producing for your baby.  False labor is a  condition in which you feel small, irregular tightenings of the muscles in the womb (contractions) that eventually go away. These are called Braxton Hicks contractions. Contractions may last for hours, days, or even weeks before true labor sets in.  Signs of labor can include: abdominal cramps; regular contractions that start at 10 minutes apart and become stronger and more frequent with time; watery or bloody mucus discharge that comes from the vagina; increased pelvic pressure and dull back pain; and leaking of amniotic fluid. This information is not intended to replace advice given to you by your health care provider. Make sure you discuss any questions you have with your health care provider. Document Released: 12/26/2000 Document Revised: 06/09/2015 Document Reviewed: 03/04/2012 Elsevier Interactive Patient Education  2017 [employer].

## 2016-05-15 LAB — CBC
Hematocrit: 32.4 % — ABNORMAL LOW (ref 34.0–46.6)
Hemoglobin: 10.7 g/dL — ABNORMAL LOW (ref 11.1–15.9)
MCH: 27.2 pg (ref 26.6–33.0)
MCHC: 33 g/dL (ref 31.5–35.7)
MCV: 82 fL (ref 79–97)
PLATELETS: 251 10*3/uL (ref 150–379)
RBC: 3.93 x10E6/uL (ref 3.77–5.28)
RDW: 13.7 % (ref 12.3–15.4)
WBC: 9 10*3/uL (ref 3.4–10.8)

## 2016-05-15 LAB — HIV ANTIBODY (ROUTINE TESTING W REFLEX): HIV SCREEN 4TH GENERATION: NONREACTIVE

## 2016-05-15 LAB — RPR: RPR Ser Ql: NONREACTIVE

## 2016-05-17 ENCOUNTER — Telehealth: Payer: Self-pay | Admitting: Family Medicine

## 2016-05-17 NOTE — Telephone Encounter (Signed)
LM for pt to call our office tomorrow. If pt calls, please give her the info below. Ottis Stain, CMA

## 2016-05-17 NOTE — Telephone Encounter (Signed)
Please call patient and let her know that all of her labs from our last visit are normal. Her hemoglobin was slightly low, and she should continue taking her prenatal vitamin with iron to help with her hemoglobin.

## 2016-05-22 NOTE — Telephone Encounter (Signed)
LVM for pt to call back to inform her of below. Zimmerman Rumple, April D, CMA  

## 2016-05-22 NOTE — Telephone Encounter (Signed)
Pt was advised. ep °

## 2016-05-29 ENCOUNTER — Ambulatory Visit (HOSPITAL_COMMUNITY)
Admission: RE | Admit: 2016-05-29 | Discharge: 2016-05-29 | Disposition: A | Discharge status: Home / Self Care | Payer: Medicaid Other | Source: Ambulatory Visit | Attending: Family Medicine | Admitting: Family Medicine

## 2016-05-29 DIAGNOSIS — Z3A28 28 weeks gestation of pregnancy: Secondary | ICD-10-CM | POA: Insufficient documentation

## 2016-05-29 DIAGNOSIS — Z362 Encounter for other antenatal screening follow-up: Secondary | ICD-10-CM | POA: Insufficient documentation

## 2016-05-29 DIAGNOSIS — O358XX0 Maternal care for other (suspected) fetal abnormality and damage, not applicable or unspecified: Secondary | ICD-10-CM

## 2016-05-29 DIAGNOSIS — O289 Unspecified abnormal findings on antenatal screening of mother: Secondary | ICD-10-CM | POA: Insufficient documentation

## 2016-05-29 DIAGNOSIS — O35EXX0 Maternal care for other (suspected) fetal abnormality and damage, fetal genitourinary anomalies, not applicable or unspecified: Secondary | ICD-10-CM

## 2016-06-14 ENCOUNTER — Ambulatory Visit (INDEPENDENT_AMBULATORY_CARE_PROVIDER_SITE_OTHER): Payer: Medicaid Other | Admitting: Family Medicine

## 2016-06-14 DIAGNOSIS — Z3490 Encounter for supervision of normal pregnancy, unspecified, unspecified trimester: Secondary | ICD-10-CM

## 2016-06-14 NOTE — Patient Instructions (Addendum)
Thank you for coming to [facility] clinic  - You did not receive your TDAP today, please get it at your next visit. - Follow up with your OB provider in 2 weeks - At next visit you will be scheduled for a repeat ultrasound to check baby's growth   Take care and seek immediate care sooner if you develop any concerns.   Dr. Bufford Lope, [place]

## 2016-06-14 NOTE — Progress Notes (Signed)
Maria Combs is a 26 y.o. G2P1001 at [redacted]w[redacted]d here for routine follow up.  She reports some occasional hot flashes that are uncomfortable but not overly concerning. See flow sheet for details.  A/P: Pregnancy at [redacted]w[redacted]d.  Doing well.   Pregnancy issues include macrosomia on last ultrasound performed 05/29/16, needs recheck of growth near term.  Infant feeding choice: breast Contraception choice: depo Infant circumcision desired: undecided, has received information on scheduling if desired  Tdap was not given today. Should receive at next appointment.  Preterm labor and fetal movement precautions reviewed. Safe sleep discussed. Follow up 2 weeks.  Bufford Lope, DO PGY-1, [facility] 06/14/2016 9:42 AM

## 2016-07-03 ENCOUNTER — Encounter: Payer: Self-pay | Admitting: Family Medicine

## 2016-07-03 ENCOUNTER — Ambulatory Visit (INDEPENDENT_AMBULATORY_CARE_PROVIDER_SITE_OTHER): Payer: Medicaid Other | Admitting: Family Medicine

## 2016-07-03 VITALS — BP 100/70 | HR 97 | Temp 98.3°F | Ht 66.0 in | Wt 172.4 lb

## 2016-07-03 DIAGNOSIS — Z3490 Encounter for supervision of normal pregnancy, unspecified, unspecified trimester: Secondary | ICD-10-CM

## 2016-07-03 DIAGNOSIS — Z23 Encounter for immunization: Secondary | ICD-10-CM | POA: Diagnosis present

## 2016-07-03 MED ORDER — ONDANSETRON 4 MG PO TBDP: 4.0000 mg | ORAL_TABLET | Freq: Three times a day (TID) | ORAL | 0 refills | Status: DC | PRN

## 2016-07-03 NOTE — Patient Instructions (Signed)
It was a pleasure to see you today! Thank you for choosing [facility] for your primary care. Maria Combs was seen for OB and viral illness.   Medications you can use while you are sick: -Tylenol for pain or fever -Chloraseptic spray (has a numbing medicine) for sore throat -Cough drops -Zofran (prescription) for nausea   Call me if you don't feel better by Friday.   You should return to our clinic to see Dr. Lindell Noe in 2 weeks for OB prenatal appointment.   Best,  Dr. Lindell Noe

## 2016-07-04 NOTE — Progress Notes (Signed)
Maria Combs is a 26 y.o. G2P1001 at [redacted]w[redacted]d here for routine follow up.  She reports cold symptoms - sore throat, vomiting, rhinorrhea, no fever, ear fullness since last Friday. Her daughter has a similar illness and was just diagnosed with AOM. Another family member with viral illness as well. Has been keeping down gatorade, liquids, and some small snacks. See flow sheet for details.  A/P: Pregnancy at [redacted]w[redacted]d.  Doing well.   Pregnancy issues include fetal pyelectasis now resolved, possible macrosomia on repeat US at 28 weeks.  Infant feeding choice: breast Contraception choice: depo Infant circumcision desired: undecided - mom wants to defer to dad, who is not here today.   Tdap was given today.  Viral illness - TMs clear, no oropharyngeal erythema or tonsillar exudate. Likely viral illness. Gave zofran for vomiting and recommended tylenol, chloraseptic, cough drops as needed. RTC if not improving by this Friday.   Possible macrosomia - discussed with Dr. Nehemiah Settle. Repeat US could be considered if patient's prior infant was small, hx of shoulder dystocia, or hx of severe tear at delivery. Patient's first child was 8lb 0.5 oz, easily delivered with no shoulder dystocia, and one 2nd degree tear easily repaired. Will not recommend repeat US as she is measuring S = D today. Can consider again at next visit. Patient in agreement.  Preterm labor and fetal movement precautions reviewed. Safe sleep discussed. Follow up 2 weeks.

## 2016-07-19 ENCOUNTER — Ambulatory Visit (INDEPENDENT_AMBULATORY_CARE_PROVIDER_SITE_OTHER): Payer: Medicaid Other | Admitting: Family Medicine

## 2016-07-19 ENCOUNTER — Encounter (HOSPITAL_COMMUNITY): Payer: Self-pay | Admitting: *Deleted

## 2016-07-19 ENCOUNTER — Inpatient Hospital Stay (HOSPITAL_COMMUNITY)
Admission: AD | Admit: 2016-07-19 | Discharge: 2016-07-19 | Disposition: A | Discharge status: Home / Self Care | Payer: Medicaid Other | Source: Ambulatory Visit | Attending: Obstetrics and Gynecology | Admitting: Obstetrics and Gynecology

## 2016-07-19 ENCOUNTER — Other Ambulatory Visit (HOSPITAL_COMMUNITY)
Admission: RE | Admit: 2016-07-19 | Discharge: 2016-07-19 | Disposition: A | Discharge status: Home / Self Care | Payer: Medicaid Other | Source: Ambulatory Visit | Attending: Family Medicine | Admitting: Family Medicine

## 2016-07-19 VITALS — BP 96/60 | HR 96 | Temp 98.2°F | Wt 174.4 lb

## 2016-07-19 DIAGNOSIS — Z3493 Encounter for supervision of normal pregnancy, unspecified, third trimester: Secondary | ICD-10-CM

## 2016-07-19 DIAGNOSIS — Z3A36 36 weeks gestation of pregnancy: Secondary | ICD-10-CM

## 2016-07-19 DIAGNOSIS — Z3689 Encounter for other specified antenatal screening: Secondary | ICD-10-CM

## 2016-07-19 DIAGNOSIS — O26893 Other specified pregnancy related conditions, third trimester: Secondary | ICD-10-CM | POA: Diagnosis not present

## 2016-07-19 DIAGNOSIS — Z113 Encounter for screening for infections with a predominantly sexual mode of transmission: Secondary | ICD-10-CM | POA: Diagnosis present

## 2016-07-19 DIAGNOSIS — N898 Other specified noninflammatory disorders of vagina: Secondary | ICD-10-CM

## 2016-07-19 LAB — URINALYSIS, ROUTINE W REFLEX MICROSCOPIC
BILIRUBIN URINE: NEGATIVE
GLUCOSE, UA: NEGATIVE mg/dL
HGB URINE DIPSTICK: NEGATIVE
Ketones, ur: 80 mg/dL — AB
Leukocytes, UA: NEGATIVE
Nitrite: NEGATIVE
PH: 5 (ref 5.0–8.0)
Protein, ur: NEGATIVE mg/dL
SPECIFIC GRAVITY, URINE: 1.016 (ref 1.005–1.030)

## 2016-07-19 LAB — POCT FERN TEST: POCT FERN TEST: NEGATIVE

## 2016-07-19 NOTE — MAU Note (Signed)
Urine in the lab  

## 2016-07-19 NOTE — MAU Note (Signed)
Pt seen @ [facility] today, is ? Ruptured, pooling seen on SSE, but negative fern.  Sent to MAU for further eval.  Pt states she is unaware of leaking fluid, denies bleeding.  Has been having irregular contractions since 1400 today.

## 2016-07-19 NOTE — Discharge Instructions (Signed)

## 2016-07-19 NOTE — Progress Notes (Signed)
Maria Combs is a 26 y.o. G2P1001 at [redacted]w[redacted]d here for routine follow up.  She reports that she feels a tightening in her upper abdomen that just began this afternoon about 2 hours ago. No regular contractions. Feeling baby move well. Denies VB or LOF. See flow sheet for details.  A/P: Pregnancy at [redacted]w[redacted]d.  Doing well.   Pregnancy issues include fetal pyelectasis now resolved, possible macrosomia on repeat US at 28 weeks.  Infant feeding choice: breast Contraception choice: depo Infant circumcision desired: yes  Tdap was not given today. Given last visit.  GBS and gc/chlamydia testing was performed today.  On speculum exam, frank pooling and membranes from os. Fingertip/thick/posterior. Vertex. Fern negative on our slide, but no confirmatory testing available here. Patient sent to MAU for further eval and admission if ruptured.   Follow up 1 week(s) if not delivered.

## 2016-07-19 NOTE — MAU Provider Note (Signed)
History   [id number]   Chief Complaint  Patient presents with  . Rupture of Membranes    HPI Maria Combs is a 26 y.o. female  G2P1001 sent from office for r/ ROM. Seen for routine PN visit today and found to have pooling on spec exam, fern was negative. Leaking of fluid has not continued.  Pt reports contractions irregular, mild. She denies vaginal bleeding. Last intercourse was just before her office visit today. She reports good fetal movement. All other systems negative.    Patient's last menstrual period was 11/09/2015 (exact date).  OB History  Gravida Para Term Preterm AB Living  2 1 1     1   SAB TAB Ectopic Multiple Live Births        0 1    # Outcome Date GA Lbr Len/2nd Weight Sex Delivery Anes PTL Lv  2 Current           1 Term 10/12/14 [redacted]w[redacted]d 10:01 / 01:15 3.785 kg (8 lb 5.5 oz) F Vag-Spont EPI  LIV      Past Medical History:  Diagnosis Date  . Asthma    inhaler prn, last used Sept. 2016  . Brain tumor (benign) ([place])   . Fibrous dysplasia of bone     Family History  Problem Relation Age of Onset  . Asthma Mother   . Asthma Sister   . Hypertension Paternal Grandmother   . Diabetes Paternal Grandmother   . Heart disease Paternal Grandfather     Social History   Social History  . Marital status: Single    Spouse name: N/A  . Number of children: N/A  . Years of education: N/A   Social History Main Topics  . Smoking status: Never Smoker  . Smokeless tobacco: Never Used  . Alcohol use No  . Drug use: No  . Sexual activity: Yes    Birth control/ protection: None   Other Topics Concern  . None   Social History Narrative  . None    No Known Allergies  No current facility-administered medications on file prior to encounter.    Current Outpatient Prescriptions on File Prior to Encounter  Medication Sig Dispense Refill  . albuterol (PROVENTIL HFA;VENTOLIN HFA) 108 (90 BASE) MCG/ACT inhaler Inhale 2 puffs into the lungs every 4 (four)  hours as needed for wheezing or shortness of breath. 1 Inhaler 3  . ondansetron (ZOFRAN-ODT) 4 MG disintegrating tablet Take 1 tablet (4 mg total) by mouth every 8 (eight) hours as needed for nausea or vomiting. 20 tablet 0  . Prenatal Vit-Fe Fumarate-FA (PRENATAL PO) Take 1 tablet by mouth daily.    . [DISCONTINUED] clindamycin-benzoyl peroxide (BENZACLIN) gel Apply topically 2 (two) times daily. to affected areas for acne     . [DISCONTINUED] Eflornithine HCl (VANIQA) 13.9 % cream Apply topically 2 (two) times daily. to affected area at least 8 hours apart     . [DISCONTINUED] Etonogestrel (IMPLANON) 68 MG IMPL Inject into the skin.         Review of Systems  Gastrointestinal: Negative for abdominal pain.  Genitourinary: Negative for vaginal bleeding and vaginal discharge.     Physical Exam   Vitals:   07/19/16 1635  BP: 120/72  Pulse: (!) 103  Resp: 16  Temp: 98.4 F (36.9 C)  TempSrc: Oral    Physical Exam  Constitutional: She is oriented to person, place, and time. She appears well-developed and well-nourished. No distress.  HENT:  Head: Normocephalic and  atraumatic.  Neck: Normal range of motion.  Respiratory: Effort normal. No respiratory distress.  GI: Soft. She exhibits no distension. There is no tenderness.  gravid  Genitourinary:  Genitourinary Comments: External: no lesions or erythema Vagina: rugated, pink, moist, +pool-clear with white specks, fern neg SVE: closed/thick   Musculoskeletal: Normal range of motion.  Neurological: She is alert and oriented to person, place, and time.  Skin: Skin is warm and dry.  Psychiatric: She has a normal mood and affect.   EFM: 135 bpm, mod variability, + accels, no decels Toco: 3-4, mild  Results for orders placed or performed during the hospital encounter of 07/19/16 (from the past 24 hour(s))  Urinalysis, Routine w reflex microscopic     Status: Abnormal   Collection Time: 07/19/16  4:40 PM  Result Value Ref Range    Color, Urine YELLOW YELLOW   APPearance CLEAR CLEAR   Specific Gravity, Urine 1.016 1.005 - 1.030   pH 5.0 5.0 - 8.0   Glucose, UA NEGATIVE NEGATIVE mg/dL   Hgb urine dipstick NEGATIVE NEGATIVE   Bilirubin Urine NEGATIVE NEGATIVE   Ketones, ur 80 (A) NEGATIVE mg/dL   Protein, ur NEGATIVE NEGATIVE mg/dL   Nitrite NEGATIVE NEGATIVE   Leukocytes, UA NEGATIVE NEGATIVE  Fern Test     Status: None   Collection Time: 07/19/16  6:08 PM  Result Value Ref Range   POCT Fern Test Negative = intact amniotic membranes    MAU Course  Procedures  MDM Labs ordered and reviewed. No evidence of SROM, pooling likely semen from recent IC. Stable for discharge home.  Assessment and Plan   1. [redacted] weeks gestation of pregnancy   2. NST (non-stress test) reactive   3. Vaginal discharge during pregnancy in third trimester    Discharge home Follow up in OB office as scheduled Labor precautions  Allergies as of 07/19/2016   No Known Allergies     Medication List    TAKE these medications   albuterol 108 (90 Base) MCG/ACT inhaler Commonly known as:  PROVENTIL HFA;VENTOLIN HFA Inhale 2 puffs into the lungs every 4 (four) hours as needed for wheezing or shortness of breath.   ondansetron 4 MG disintegrating tablet Commonly known as:  ZOFRAN-ODT Take 1 tablet (4 mg total) by mouth every 8 (eight) hours as needed for nausea or vomiting.   PRENATAL PO Take 1 tablet by mouth daily.      Julianne Handler, CNM 07/19/2016 5:30 PM

## 2016-07-20 LAB — CERVICOVAGINAL ANCILLARY ONLY
CHLAMYDIA, DNA PROBE: NEGATIVE
NEISSERIA GONORRHEA: NEGATIVE

## 2016-07-22 LAB — CULTURE, BETA STREP (GROUP B ONLY): STREP GP B CULTURE: POSITIVE — AB

## 2016-07-25 ENCOUNTER — Ambulatory Visit (INDEPENDENT_AMBULATORY_CARE_PROVIDER_SITE_OTHER): Payer: Medicaid Other | Admitting: Family Medicine

## 2016-07-25 VITALS — BP 92/60 | HR 88 | Temp 98.3°F | Wt 179.2 lb

## 2016-07-25 DIAGNOSIS — Z3483 Encounter for supervision of other normal pregnancy, third trimester: Secondary | ICD-10-CM

## 2016-07-25 NOTE — Patient Instructions (Signed)
You are doing great! Go to the MAU if you have contractions, bleeding, leakage of fluid, or don't feel baby move as normal. See you in 1 week. Your Group B strep was positive, meaning you need antibiotics in labor.   Ralene Ok, MD

## 2016-07-26 NOTE — Progress Notes (Signed)
Maria Combs is a 26 y.o. G2P1001 at [redacted]w[redacted]d here for routine follow up.  She reports occasional lightening crotch, no discharge or concerns. See flow sheet for details.  A/P: Pregnancy at [redacted]w[redacted]d. Doing well.   Pregnancy issues include  fetal pyelectasis now resolved, possible macrosomia on repeat US at 28 weeks.  Infant feeding choice: breastfeeding (current breastfeeding her daughter) Contraception choice: depo Infant circumcision desired: yes  GBS and gc/chlamydia testing results were reviewed today.   S = D, no indication for growth ultrasound based on today's exam.  Patient deferred SVE.  Labor and fetal movement precautions reviewed. Follow up 1 week.

## 2016-08-03 ENCOUNTER — Ambulatory Visit (INDEPENDENT_AMBULATORY_CARE_PROVIDER_SITE_OTHER): Payer: Medicaid Other | Admitting: Family Medicine

## 2016-08-03 VITALS — BP 92/66 | HR 79 | Temp 98.7°F | Wt 178.2 lb

## 2016-08-03 DIAGNOSIS — Z3483 Encounter for supervision of other normal pregnancy, third trimester: Secondary | ICD-10-CM

## 2016-08-03 NOTE — Progress Notes (Signed)
Maria Combs is a 26 y.o. G2P1001 at [redacted]w[redacted]d here for routine follow up.  She reports contractions yesterday x 1.5 hours about 10 min apart. No vaginal discharge, no bleeding, good FM. See flow sheet for details.  A/P: Pregnancy at [redacted]w[redacted]d. Doing well.   Pregnancy issues include possible macrosomia on 3rd trimester [place], no repeat.  Infant feeding choice: breast Contraception choice: depo Infant circumcision desired: yes  SVE: 1cm/thick/posterior Vertex confirmed by bedside US.  GBS and gc/chlamydia testing results were reviewed today.  GBS positive.  Labor and fetal movement precautions reviewed. Follow up 1 week.

## 2016-08-03 NOTE — Patient Instructions (Signed)
It was a pleasure to see you today! Thank you for choosing [facility] for your primary care. Maria Combs was seen for follow up pregnancy.   Our plans for today were:  Keep drinking water and walk as you feel like it  Go to the MAU when you start having contractions that are frequent.  You should return to our clinic to see Dr. Lindell Noe in 1 week for follow up OB.   Best,  Dr. Lindell Noe

## 2016-08-08 ENCOUNTER — Inpatient Hospital Stay (HOSPITAL_COMMUNITY)
Admission: AD | Admit: 2016-08-08 | Discharge: 2016-08-09 | DRG: 775 | Disposition: A | Discharge status: Home / Self Care | Payer: Medicaid Other | Source: Ambulatory Visit | Attending: Obstetrics & Gynecology | Admitting: Obstetrics & Gynecology

## 2016-08-08 ENCOUNTER — Encounter (HOSPITAL_COMMUNITY): Payer: Self-pay | Admitting: *Deleted

## 2016-08-08 ENCOUNTER — Inpatient Hospital Stay (HOSPITAL_COMMUNITY): Payer: Medicaid Other | Admitting: Anesthesiology

## 2016-08-08 DIAGNOSIS — J45909 Unspecified asthma, uncomplicated: Secondary | ICD-10-CM | POA: Diagnosis present

## 2016-08-08 DIAGNOSIS — O4292 Full-term premature rupture of membranes, unspecified as to length of time between rupture and onset of labor: Secondary | ICD-10-CM | POA: Diagnosis present

## 2016-08-08 DIAGNOSIS — O99824 Streptococcus B carrier state complicating childbirth: Secondary | ICD-10-CM | POA: Diagnosis present

## 2016-08-08 DIAGNOSIS — Z3A39 39 weeks gestation of pregnancy: Secondary | ICD-10-CM

## 2016-08-08 DIAGNOSIS — O9952 Diseases of the respiratory system complicating childbirth: Secondary | ICD-10-CM | POA: Diagnosis present

## 2016-08-08 LAB — CBC
HEMATOCRIT: 32.6 % — AB (ref 36.0–46.0)
HEMOGLOBIN: 10.2 g/dL — AB (ref 12.0–15.0)
MCH: 24.1 pg — ABNORMAL LOW (ref 26.0–34.0)
MCHC: 31.3 g/dL (ref 30.0–36.0)
MCV: 76.9 fL — ABNORMAL LOW (ref 78.0–100.0)
Platelets: 249 10*3/uL (ref 150–400)
RBC: 4.24 MIL/uL (ref 3.87–5.11)
RDW: 15.6 % — AB (ref 11.5–15.5)
WBC: 9.7 10*3/uL (ref 4.0–10.5)

## 2016-08-08 LAB — TYPE AND SCREEN
ABO/RH(D): A POS
Antibody Screen: NEGATIVE

## 2016-08-08 LAB — RPR: RPR Ser Ql: NONREACTIVE

## 2016-08-08 LAB — POCT FERN TEST: POCT Fern Test: POSITIVE

## 2016-08-08 MED ORDER — DIPHENHYDRAMINE HCL 25 MG PO CAPS: 25.0000 mg | ORAL_CAPSULE | Freq: Four times a day (QID) | ORAL | Status: DC | PRN

## 2016-08-08 MED ORDER — LACTATED RINGERS IV SOLN
500.0000 mL | INTRAVENOUS | Status: DC | PRN
  Administered 2016-08-08: 500 mL via INTRAVENOUS

## 2016-08-08 MED ORDER — LACTATED RINGERS IV SOLN
INTRAVENOUS | Status: DC
  Administered 2016-08-08 (×2): via INTRAVENOUS

## 2016-08-08 MED ORDER — EPHEDRINE 5 MG/ML INJ
10.0000 mg | INTRAVENOUS | Status: DC | PRN
  Filled 2016-08-08: qty 2

## 2016-08-08 MED ORDER — FENTANYL 2.5 MCG/ML BUPIVACAINE 1/10 % EPIDURAL INFUSION (WH - ANES)
14.0000 mL/h | INTRAMUSCULAR | Status: DC | PRN
  Administered 2016-08-08 (×2): 14 mL/h via EPIDURAL

## 2016-08-08 MED ORDER — COCONUT OIL OIL: 1.0000 "application " | TOPICAL_OIL | Status: DC | PRN

## 2016-08-08 MED ORDER — EPHEDRINE 5 MG/ML INJ: 10.0000 mg | INTRAVENOUS | Status: DC | PRN

## 2016-08-08 MED ORDER — PHENYLEPHRINE 40 MCG/ML (10ML) SYRINGE FOR IV PUSH (FOR BLOOD PRESSURE SUPPORT)
PREFILLED_SYRINGE | INTRAVENOUS | Status: AC
  Filled 2016-08-08: qty 20

## 2016-08-08 MED ORDER — PENICILLIN G POTASSIUM 5000000 UNITS IJ SOLR
5.0000 10*6.[IU] | Freq: Once | INTRAVENOUS | Status: AC
  Administered 2016-08-08: 5 10*6.[IU] via INTRAVENOUS
  Filled 2016-08-08: qty 5

## 2016-08-08 MED ORDER — OXYCODONE-ACETAMINOPHEN 5-325 MG PO TABS: 1.0000 | ORAL_TABLET | ORAL | Status: DC | PRN

## 2016-08-08 MED ORDER — ACETAMINOPHEN 325 MG PO TABS: 650.0000 mg | ORAL_TABLET | ORAL | Status: DC | PRN

## 2016-08-08 MED ORDER — SIMETHICONE 80 MG PO CHEW: 80.0000 mg | CHEWABLE_TABLET | ORAL | Status: DC | PRN

## 2016-08-08 MED ORDER — PRENATAL MULTIVITAMIN CH
1.0000 | ORAL_TABLET | Freq: Every day | ORAL | Status: DC
  Administered 2016-08-09: 1 via ORAL
  Filled 2016-08-08: qty 1

## 2016-08-08 MED ORDER — LIDOCAINE HCL (PF) 1 % IJ SOLN
INTRAMUSCULAR | Status: DC | PRN
  Administered 2016-08-08: 4 mL via EPIDURAL

## 2016-08-08 MED ORDER — DIPHENHYDRAMINE HCL 50 MG/ML IJ SOLN: 12.5000 mg | INTRAMUSCULAR | Status: DC | PRN

## 2016-08-08 MED ORDER — SENNOSIDES-DOCUSATE SODIUM 8.6-50 MG PO TABS
2.0000 | ORAL_TABLET | ORAL | Status: DC
  Administered 2016-08-08: 2 via ORAL
  Filled 2016-08-08: qty 2

## 2016-08-08 MED ORDER — PHENYLEPHRINE 40 MCG/ML (10ML) SYRINGE FOR IV PUSH (FOR BLOOD PRESSURE SUPPORT)
80.0000 ug | PREFILLED_SYRINGE | INTRAVENOUS | Status: DC | PRN
  Filled 2016-08-08: qty 5

## 2016-08-08 MED ORDER — ZOLPIDEM TARTRATE 5 MG PO TABS: 5.0000 mg | ORAL_TABLET | Freq: Every evening | ORAL | Status: DC | PRN

## 2016-08-08 MED ORDER — OXYTOCIN BOLUS FROM INFUSION
500.0000 mL | Freq: Once | INTRAVENOUS | Status: AC
  Administered 2016-08-08: 500 mL via INTRAVENOUS

## 2016-08-08 MED ORDER — LIDOCAINE HCL (PF) 1 % IJ SOLN
30.0000 mL | INTRAMUSCULAR | Status: DC | PRN
  Filled 2016-08-08: qty 30

## 2016-08-08 MED ORDER — SOD CITRATE-CITRIC ACID 500-334 MG/5ML PO SOLN: 30.0000 mL | ORAL | Status: DC | PRN

## 2016-08-08 MED ORDER — DIBUCAINE 1 % RE OINT: 1.0000 "application " | TOPICAL_OINTMENT | RECTAL | Status: DC | PRN

## 2016-08-08 MED ORDER — BENZOCAINE-MENTHOL 20-0.5 % EX AERO
1.0000 "application " | INHALATION_SPRAY | CUTANEOUS | Status: DC | PRN
  Administered 2016-08-08: 1 via TOPICAL
  Filled 2016-08-08: qty 56

## 2016-08-08 MED ORDER — TERBUTALINE SULFATE 1 MG/ML IJ SOLN
0.2500 mg | Freq: Once | INTRAMUSCULAR | Status: DC | PRN
  Filled 2016-08-08: qty 1

## 2016-08-08 MED ORDER — TETANUS-DIPHTH-ACELL PERTUSSIS 5-2.5-18.5 LF-MCG/0.5 IM SUSP
0.5000 mL | Freq: Once | INTRAMUSCULAR | Status: DC
  Filled 2016-08-08: qty 0.5

## 2016-08-08 MED ORDER — ONDANSETRON HCL 4 MG/2ML IJ SOLN: 4.0000 mg | INTRAMUSCULAR | Status: DC | PRN

## 2016-08-08 MED ORDER — OXYTOCIN 40 UNITS IN LACTATED RINGERS INFUSION - SIMPLE MED
1.0000 m[IU]/min | INTRAVENOUS | Status: DC
  Administered 2016-08-08: 2 m[IU]/min via INTRAVENOUS

## 2016-08-08 MED ORDER — PHENYLEPHRINE 40 MCG/ML (10ML) SYRINGE FOR IV PUSH (FOR BLOOD PRESSURE SUPPORT): 80.0000 ug | PREFILLED_SYRINGE | INTRAVENOUS | Status: DC | PRN

## 2016-08-08 MED ORDER — FENTANYL 2.5 MCG/ML BUPIVACAINE 1/10 % EPIDURAL INFUSION (WH - ANES)
INTRAMUSCULAR | Status: AC
  Filled 2016-08-08: qty 100

## 2016-08-08 MED ORDER — IBUPROFEN 600 MG PO TABS
600.0000 mg | ORAL_TABLET | Freq: Four times a day (QID) | ORAL | Status: DC
  Administered 2016-08-08 – 2016-08-09 (×4): 600 mg via ORAL
  Filled 2016-08-08 (×4): qty 1

## 2016-08-08 MED ORDER — PENICILLIN G POT IN DEXTROSE 60000 UNIT/ML IV SOLN
3.0000 10*6.[IU] | INTRAVENOUS | Status: DC
  Administered 2016-08-08 (×2): 3 10*6.[IU] via INTRAVENOUS
  Filled 2016-08-08 (×5): qty 50

## 2016-08-08 MED ORDER — WITCH HAZEL-GLYCERIN EX PADS: 1.0000 "application " | MEDICATED_PAD | CUTANEOUS | Status: DC | PRN

## 2016-08-08 MED ORDER — LACTATED RINGERS IV SOLN: 500.0000 mL | Freq: Once | INTRAVENOUS | Status: DC

## 2016-08-08 MED ORDER — ONDANSETRON HCL 4 MG PO TABS: 4.0000 mg | ORAL_TABLET | ORAL | Status: DC | PRN

## 2016-08-08 MED ORDER — OXYCODONE-ACETAMINOPHEN 5-325 MG PO TABS: 2.0000 | ORAL_TABLET | ORAL | Status: DC | PRN

## 2016-08-08 MED ORDER — FLEET ENEMA 7-19 GM/118ML RE ENEM: 1.0000 | ENEMA | RECTAL | Status: DC | PRN

## 2016-08-08 MED ORDER — ONDANSETRON HCL 4 MG/2ML IJ SOLN: 4.0000 mg | Freq: Four times a day (QID) | INTRAMUSCULAR | Status: DC | PRN

## 2016-08-08 MED ORDER — LACTATED RINGERS IV SOLN
500.0000 mL | Freq: Once | INTRAVENOUS | Status: AC
  Administered 2016-08-08: 500 mL via INTRAVENOUS

## 2016-08-08 MED ORDER — OXYTOCIN 40 UNITS IN LACTATED RINGERS INFUSION - SIMPLE MED
2.5000 [IU]/h | INTRAVENOUS | Status: DC
  Filled 2016-08-08: qty 1000

## 2016-08-08 NOTE — H&P (Signed)
Obstetric History and Physical  Maria Combs is a 26 y.o. G2P1001 with IUP at [redacted]w[redacted]d presenting for SROM at 0300 wuth clear color. Patient states she has been having  regular, every "few" minutes contractions, none vaginal bleeding, intact, ruptured membranes, with active fetal movement.   She has a PMH notable for the following:  Past Medical History:  Diagnosis Date  . Asthma    inhaler prn, last used Sept. 2016  . Brain tumor (benign) ([place])   . Fibrous dysplasia of bone     Prenatal Course Source of Care:  Fam-med  with onset of care at 4 weeks Pregnancy complications or risks: Patient Active Problem List   Diagnosis Date Noted  . Pregnancy 12/12/2015  . Syncope 11/16/2015  . Excessive postpartum bleeding 10/30/2014  . 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 Headache disorder 10/12/2014  . Asthma    She plans to breastfeed She desires Depo-Provera for postpartum contraception.   Prenatal labs and studies: ABO, Rh: --/--/A POS (12/15 1803) Antibody: NEG (12/11 1402) Rubella: 2.42 (12/11 1402) RPR: Non Reactive (04/30 1232)  HBsAg: NEGATIVE (12/11 1402)  HIV: NONREACTIVE (12/11 1402)  GBS: positive 1 hr Glucola negative Genetic screening normal Anatomy US normal; renal pyelactasis resolved in 5-18  Prenatal Transfer Tool  Maternal Diabetes: No Genetic Screening: Normal Maternal Ultrasounds/Referrals: Normal Fetal Ultrasounds or other Referrals:  None, pyelectasis (dilation of renal pelvis) noted resolved on 05/29/16 Maternal Substance Abuse:  No Significant Maternal Medications:  None Significant Maternal Lab Results: None  Past Medical History:  Diagnosis Date  . Asthma    inhaler prn, last used Sept. 2016  . Brain tumor (benign) ([place])   . Fibrous dysplasia of bone     Past Surgical History:  Procedure Laterality Date  . cranial surgery    . WISDOM TOOTH EXTRACTION Bilateral 01/16/2008    OB History  Gravida  Para Term Preterm AB Living  2 1 1     1   SAB TAB Ectopic Multiple Live Births        0 1    # Outcome Date GA Lbr Len/2nd Weight Sex Delivery Anes PTL Lv  2 Current           1 Term 10/12/14 [redacted]w[redacted]d 10:01 / 01:15 3.785 kg (8 lb 5.5 oz) F Vag-Spont EPI  LIV      Social History   Social History  . Marital status: Single    Spouse name: N/A  . Number of children: N/A  . Years of education: N/A   Social History Main Topics  . Smoking status: Never Smoker  . Smokeless tobacco: Never Used  . Alcohol use No  . Drug use: No  . Sexual activity: Yes    Birth control/ protection: None   Other Topics Concern  . None   Social History Narrative  . None    Family History  Problem Relation Age of Onset  . Asthma Mother   . Asthma Sister   . Hypertension Paternal Grandmother   . Diabetes Paternal Grandmother   . Heart disease Paternal Grandfather     Prescriptions Prior to Admission  Medication Sig Dispense Refill Last Dose  . Prenatal Vit-Fe Fumarate-FA (PRENATAL PO) Take 1 tablet by mouth daily.   08/07/2016 at Unknown time  . albuterol (PROVENTIL HFA;VENTOLIN HFA) 108 (90 BASE) MCG/ACT inhaler Inhale 2 puffs into the lungs every 4 (four) hours as needed for wheezing or shortness of breath. 1 Inhaler  3 More than a month at Unknown time  . ondansetron (ZOFRAN-ODT) 4 MG disintegrating tablet Take 1 tablet (4 mg total) by mouth every 8 (eight) hours as needed for nausea or vomiting. 20 tablet 0 More than a month at Unknown time    No Known Allergies  Review of Systems: Negative except for what is mentioned in HPI.  Physical Exam: BP 124/79 (BP Location: Right Arm)   Pulse 76   Temp 98.4 F (36.9 C) (Oral)   Resp 16   Ht 5\' 6"  (1.676 m)   Wt 81.6 kg (180 lb)   LMP 11/09/2015 (Exact Date)   BMI 29.05 kg/m  CONSTITUTIONAL: Well-developed, well-nourished female in no acute distress.  SKIN: Skin is warm and dry. No rash noted. Not diaphoretic. No erythema. No  pallor. PSYCHIATRIC: Normal mood and affect. Normal behavior. Normal judgment and thought content. CARDIOVASCULAR: Normal heart rate noted, regular rhythm RESPIRATORY: Effort and breath sounds normal, no problems with respiration noted ABDOMEN: Soft, nontender, nondistended, gravid.  Dilation: 3 Effacement (%): 60 Cervical Position: Posterior Station: -3 Presentation: Vertex Exam by:: K. WeissRN   Pertinent Labs/Studies:   Results for orders placed or performed during the hospital encounter of 08/08/16 (from the past 24 hour(s))  Fern Test     Status: None   Collection Time: 08/08/16  4:35 AM  Result Value Ref Range   POCT Fern Test Positive = ruptured amniotic membanes     Assessment : Maria Combs is a 26 y.o. G2P1001 at [redacted]w[redacted]d being admitted for SROM/SOL. Plan: Labor: Expectant management with Agmentation as needed as per protocol. Analgesia as needed.wants epidural. FWB: 125 bpm with present acel, mod variability, no decels, Contractions are 4-5 min apart.  GBS positive; Ampicillin ordered.  Delivery plan: NSVD   Rodney Booze, Medical Student I confirm that I have verified the information documented in the medical student's note and that I have also personally reperformed the physical exam and all medical decision making activities. Maye Hides CNM

## 2016-08-08 NOTE — Anesthesia Pain Management Evaluation Note (Signed)
  CRNA Pain Management Visit Note  Patient: Maria Combs, 26 y.o., female  "Hello I am a member of the anesthesia team at [facility]. We have an anesthesia team available at all times to provide care throughout the hospital, including epidural management and anesthesia for C-section. I don't know your plan for the delivery whether it a natural birth, water birth, IV sedation, nitrous supplementation, doula or epidural, but we want to meet your pain goals."   1.Was your pain managed to your expectations on prior hospitalizations?   Yes   2.What is your expectation for pain management during this hospitalization?     Epidural  3.How can we help you reach that goal?   Record the patient's initial score and the patient's pain goal.   Pain: 6  Pain Goal: 9 [facility] wants you to be able to say your pain was always managed very well.  Jabier Mutton 08/08/2016

## 2016-08-08 NOTE — Progress Notes (Signed)
Patient ID: Maria Combs, female   DOB: Jul 01, 1990, 26 y.o.   MRN: [id number] Labor Progress Note  S: Patient seen & examined for progress of labor. Patient comfortable.   O: BP (!) 102/55   Pulse 76   Temp 98.5 F (36.9 C) (Oral)   Resp 20   Ht 5\' 6"  (1.676 m)   Wt 81.6 kg (180 lb)   LMP 11/09/2015 (Exact Date)   BMI 29.05 kg/m   FHT: 135bpm, mod var, +accels, no decels TOCO: irregular, patient looks comfortable during contractions  CVE: Dilation: 4 Effacement (%): 50 Cervical Position: Middle Station: -3 Presentation: Vertex Exam by:: Dr. Enid Derry  A&P: 26 y.o. G2P1001 [redacted]w[redacted]d SROM @ 0300  Start pitocin for augmentation after she has a small meal Continue current management Anticipate SVD  [place] Lehi Phifer, DO FM Resident PGY-1 08/08/2016 9:11 AM

## 2016-08-08 NOTE — MAU Note (Signed)
PT  SAYS  SROM   AT 0300 . VE LAST WEEK  1 CM .  GBS- POSITIVE     DENIES HSV AND  MRSA    SAYS SOME IRREG UC

## 2016-08-08 NOTE — Anesthesia Preprocedure Evaluation (Signed)
Anesthesia Evaluation  Patient identified by MRN, date of birth, ID band Patient awake    Reviewed: Allergy & Precautions, Patient's Chart, lab work & pertinent test results  Airway Mallampati: I       Dental no notable dental hx.    Pulmonary neg pulmonary ROS,    Pulmonary exam normal        Cardiovascular negative cardio ROS Normal cardiovascular exam     Neuro/Psych negative neurological ROS     GI/Hepatic negative GI ROS, Neg liver ROS,   Endo/Other  negative endocrine ROS  Renal/GU negative Renal ROS     Musculoskeletal negative musculoskeletal ROS (+)   Abdominal   Peds  Hematology negative hematology ROS (+)   Anesthesia Other Findings Day of surgery medications reviewed with the patient.  Reproductive/Obstetrics (+) Pregnancy                             Lab Results  Component Value Date   WBC 9.7 08/08/2016   HGB 10.2 (L) 08/08/2016   HCT 32.6 (L) 08/08/2016   MCV 76.9 (L) 08/08/2016   PLT 249 08/08/2016   Anesthesia Physical Anesthesia Plan  ASA: II  Anesthesia Plan: Epidural   Post-op Pain Management:    Induction:   PONV Risk Score and Plan:   Airway Management Planned:   Additional Equipment:   Intra-op Plan:   Post-operative Plan:   Informed Consent:   Plan Discussed with:   Anesthesia Plan Comments:         Anesthesia Quick Evaluation

## 2016-08-08 NOTE — Progress Notes (Addendum)
Patient ID: Maria Combs, female   DOB: Jul 31, 1990, 26 y.o.   MRN: [id number] Labor Progress Note  S: Patient seen & examined for progress of labor. Patient comfortable with epidural. Informed that her continuity delivery will be unable to take place, as her PCP is on a rotation where she can't leave.   O: BP (!) 109/57   Pulse 67   Temp 98 F (36.7 C) (Oral)   Resp 18   Ht 5\' 6"  (1.676 m)   Wt 81.6 kg (180 lb)   LMP 11/09/2015 (Exact Date)   SpO2 100%   BMI 29.05 kg/m   FHT: 125 bpm, mod var, +accels, no decels TOCO: q1-3 min, patient looks comfortable during contractions  CVE: Dilation: 4.5 Effacement (%): 60 Cervical Position: Posterior Station: -3 Presentation: Vertex Exam by:: Ruben Im RNC  A&P: 26 y.o. G2P1001 [redacted]w[redacted]d SROM @0300   Currently on 2 milli-units of pitocin, increase as tolerated Continue current management Anticipate SVD  [place] Vaudie Engebretsen, DO FM Resident PGY-1 08/08/2016 1:04 PM

## 2016-08-08 NOTE — Anesthesia Procedure Notes (Signed)
Epidural Patient location during procedure: OB Start time: 08/08/2016 12:18 PM End time: 08/08/2016 12:25 PM  Staffing Anesthesiologist: Suella Broad D Performed: anesthesiologist   Preanesthetic Checklist Completed: patient identified, site marked, surgical consent, pre-op evaluation, timeout performed, IV checked, risks and benefits discussed and monitors and equipment checked  Epidural Patient position: sitting Prep: ChloraPrep Patient monitoring: heart rate, continuous pulse ox and blood pressure Approach: midline Location: L3-L4 Injection technique: LOR saline  Needle:  Needle type: Tuohy  Needle gauge: 17 G Needle length: 9 cm Catheter type: closed end flexible Catheter size: 20 Guage Test dose: negative and 1.5% lidocaine  Assessment Events: blood not aspirated, injection not painful, no injection resistance and no paresthesia  Additional Notes LOR @ 5  Patient identified. Risks/Benefits/Options discussed with patient including but not limited to bleeding, infection, nerve damage, paralysis, failed block, incomplete pain control, headache, blood pressure changes, nausea, vomiting, reactions to medications, itching and postpartum back pain. Confirmed with bedside nurse the patient's most recent platelet count. Confirmed with patient that they are not currently taking any anticoagulation, have any bleeding history or any family history of bleeding disorders. Patient expressed understanding and wished to proceed. All questions were answered. Sterile technique was used throughout the entire procedure. Please see nursing notes for vital signs. Test dose was given through epidural catheter and negative prior to continuing to dose epidural or start infusion. Warning signs of high block given to the patient including shortness of breath, tingling/numbness in hands, complete motor block, or any concerning symptoms with instructions to call for help. Patient was given instructions on  fall risk and not to get out of bed. All questions and concerns addressed with instructions to call with any issues or inadequate analgesia.    Reason for block:procedure for pain

## 2016-08-09 MED ORDER — MEDROXYPROGESTERONE ACETATE 150 MG/ML IM SUSP: 150.0000 mg | Freq: Once | INTRAMUSCULAR | Status: DC

## 2016-08-09 NOTE — Plan of Care (Signed)
Problem: Activity: Goal: Ability to tolerate increased activity will improve Outcome: Completed/Met Date Met: 08/09/16 Patient is ambulating well without assistance.

## 2016-08-09 NOTE — Discharge Instructions (Signed)
Breastfeeding Deciding to breastfeed is one of the best choices you can make for you and your baby. A change in hormones during pregnancy causes your breast tissue to grow and increases the number and size of your milk ducts. These hormones also allow proteins, sugars, and fats from your blood supply to make breast milk in your milk-producing glands. Hormones prevent breast milk from being released before your baby is born as well as prompt milk flow after birth. Once breastfeeding has begun, thoughts of your baby, as well as his or her sucking or crying, can stimulate the release of milk from your milk-producing glands. Benefits of breastfeeding For Your Baby  Your first milk (colostrum) helps your baby's digestive system function better.  There are antibodies in your milk that help your baby fight off infections.  Your baby has a lower incidence of asthma, allergies, and sudden infant death syndrome.  The nutrients in breast milk are better for your baby than infant formulas and are designed uniquely for your babys needs.  Breast milk improves your baby's brain development.  Your baby is less likely to develop other conditions, such as childhood obesity, asthma, or type 2 diabetes mellitus.  For You  Breastfeeding helps to create a very special bond between you and your baby.  Breastfeeding is convenient. Breast milk is always available at the correct temperature and costs nothing.  Breastfeeding helps to burn calories and helps you lose the weight gained during pregnancy.  Breastfeeding makes your uterus contract to its prepregnancy size faster and slows bleeding (lochia) after you give birth.  Breastfeeding helps to lower your risk of developing type 2 diabetes mellitus, osteoporosis, and breast or ovarian cancer later in life.  Signs that your baby is hungry Early Signs of Hunger  Increased alertness or activity.  Stretching.  Movement of the head from side to  side.  Movement of the head and opening of the mouth when the corner of the mouth or cheek is stroked (rooting).  Increased sucking sounds, smacking lips, cooing, sighing, or squeaking.  Hand-to-mouth movements.  Increased sucking of fingers or hands.  Late Signs of Hunger  Fussing.  Intermittent crying.  Extreme Signs of Hunger Signs of extreme hunger will require calming and consoling before your baby will be able to breastfeed successfully. Do not wait for the following signs of extreme hunger to occur before you initiate breastfeeding:  Restlessness.  A loud, strong cry.  Screaming.  Breastfeeding basics Breastfeeding Initiation  Find a comfortable place to sit or lie down, with your neck and back well supported.  Place a pillow or rolled up blanket under your baby to bring him or her to the level of your breast (if you are seated). Nursing pillows are specially designed to help support your arms and your baby while you breastfeed.  Make sure that your baby's abdomen is facing your abdomen.  Gently massage your breast. With your fingertips, massage from your chest wall toward your nipple in a circular motion. This encourages milk flow. You may need to continue this action during the feeding if your milk flows slowly.  Support your breast with 4 fingers underneath and your thumb above your nipple. Make sure your fingers are well away from your nipple and your babys mouth.  Stroke your baby's lips gently with your finger or nipple.  When your baby's mouth is open wide enough, quickly bring your baby to your breast, placing your entire nipple and as much of the colored area  around your nipple (areola) as possible into your baby's mouth. ? More areola should be visible above your baby's upper lip than below the lower lip. ? Your baby's tongue should be between his or her lower gum and your breast.  Ensure that your baby's mouth is correctly positioned around your nipple  (latched). Your baby's lips should create a seal on your breast and be turned out (everted).  It is common for your baby to suck about 2-3 minutes in order to start the flow of breast milk.  Latching Teaching your baby how to latch on to your breast properly is very important. An improper latch can cause nipple pain and decreased milk supply for you and poor weight gain in your baby. Also, if your baby is not latched onto your nipple properly, he or she may swallow some air during feeding. This can make your baby fussy. Burping your baby when you switch breasts during the feeding can help to get rid of the air. However, teaching your baby to latch on properly is still the best way to prevent fussiness from swallowing air while breastfeeding. Signs that your baby has successfully latched on to your nipple:  Silent tugging or silent sucking, without causing you pain.  Swallowing heard between every 3-4 sucks.  Muscle movement above and in front of his or her ears while sucking.  Signs that your baby has not successfully latched on to nipple:  Sucking sounds or smacking sounds from your baby while breastfeeding.  Nipple pain.  If you think your baby has not latched on correctly, slip your finger into the corner of your babys mouth to break the suction and place it between your baby's gums. Attempt breastfeeding initiation again. Signs of Successful Breastfeeding Signs from your baby:  A gradual decrease in the number of sucks or complete cessation of sucking.  Falling asleep.  Relaxation of his or her body.  Retention of a small amount of milk in his or her mouth.  Letting go of your breast by himself or herself.  Signs from you:  Breasts that have increased in firmness, weight, and size 1-3 hours after feeding.  Breasts that are softer immediately after breastfeeding.  Increased milk volume, as well as a change in milk consistency and color by the fifth day of  breastfeeding.  Nipples that are not sore, cracked, or bleeding.  Signs That Your [employer] is Getting Enough Milk  Wetting at least 1-2 diapers during the first 24 hours after birth.  Wetting at least 5-6 diapers every 24 hours for the first week after birth. The urine should be clear or pale yellow by 5 days after birth.  Wetting 6-8 diapers every 24 hours as your baby continues to grow and develop.  At least 3 stools in a 24-hour period by age 925 days. The stool should be soft and yellow.  At least 3 stools in a 24-hour period by age 92 days. The stool should be seedy and yellow.  No loss of weight greater than 10% of birth weight during the first 19 days of age.  Average weight gain of 4-7 ounces (113-198 g) per week after age 67 days.  Consistent daily weight gain by age 677 days, without weight loss after the age of 2 weeks.  After a feeding, your baby may spit up a small amount. This is common. Breastfeeding frequency and duration Frequent feeding will help you make more milk and can prevent sore nipples and breast engorgement. Breastfeed when  you feel the need to reduce the fullness of your breasts or when your baby shows signs of hunger. This is called "breastfeeding on demand." Avoid introducing a pacifier to your baby while you are working to establish breastfeeding (the first 4-6 weeks after your baby is born). After this time you may choose to use a pacifier. Research has shown that pacifier use during the first year of a baby's life decreases the risk of sudden infant death syndrome (SIDS). Allow your baby to feed on each breast as long as he or she wants. Breastfeed until your baby is finished feeding. When your baby unlatches or falls asleep while feeding from the first breast, offer the second breast. Because newborns are often sleepy in the first few weeks of life, you may need to awaken your baby to get him or her to feed. Breastfeeding times will vary from baby to baby. However,  the following rules can serve as a guide to help you ensure that your baby is properly fed:  Newborns (babies 87 weeks of age or younger) may breastfeed every 1-3 hours.  Newborns should not go longer than 3 hours during the day or 5 hours during the night without breastfeeding.  You should breastfeed your baby a minimum of 8 times in a 24-hour period until you begin to introduce solid foods to your baby at around 2 months of age.  Breast milk pumping Pumping and storing breast milk allows you to ensure that your baby is exclusively fed your breast milk, even at times when you are unable to breastfeed. This is especially important if you are going back to work while you are still breastfeeding or when you are not able to be present during feedings. Your lactation consultant can give you guidelines on how long it is safe to store breast milk. A breast pump is a machine that allows you to pump milk from your breast into a sterile bottle. The pumped breast milk can then be stored in a refrigerator or freezer. Some breast pumps are operated by hand, while others use electricity. Ask your lactation consultant which type will work best for you. Breast pumps can be purchased, but some hospitals and breastfeeding support groups lease breast pumps on a monthly basis. A lactation consultant can teach you how to hand express breast milk, if you prefer not to use a pump. Caring for your breasts while you breastfeed Nipples can become dry, cracked, and sore while breastfeeding. The following recommendations can help keep your breasts moisturized and healthy:  Avoid using soap on your nipples.  Wear a supportive bra. Although not required, special nursing bras and tank tops are designed to allow access to your breasts for breastfeeding without taking off your entire bra or top. Avoid wearing underwire-style bras or extremely tight bras.  Air dry your nipples for 3-65mnutes after each feeding.  Use only cotton  bra pads to absorb leaked breast milk. Leaking of breast milk between feedings is normal.  Use lanolin on your nipples after breastfeeding. Lanolin helps to maintain your skin's normal moisture barrier. If you use pure lanolin, you do not need to wash it off before feeding your baby again. Pure lanolin is not toxic to your baby. You may also hand express a few drops of breast milk and gently massage that milk into your nipples and allow the milk to air dry.  In the first few weeks after giving birth, some women experience extremely full breasts (engorgement). Engorgement can make your  breasts feel heavy, warm, and tender to the touch. Engorgement peaks within 3-5 days after you give birth. The following recommendations can help ease engorgement: °· Completely empty your breasts while breastfeeding or pumping. You may want to start by applying warm, moist heat (in the shower or with warm water-soaked hand towels) just before feeding or pumping. This increases circulation and helps the milk flow. If your baby does not completely empty your breasts while breastfeeding, pump any extra milk after he or she is finished. °· Wear a snug bra (nursing or regular) or tank top for 1-2 days to signal your body to slightly decrease milk production. °· Apply ice packs to your breasts, unless this is too uncomfortable for you. °· Make sure that your baby is latched on and positioned properly while breastfeeding. ° °If engorgement persists after 48 hours of following these recommendations, contact your health care provider or a lactation consultant. °Overall health care recommendations while breastfeeding °· Eat healthy foods. Alternate between meals and snacks, eating 3 of each per day. Because what you eat affects your breast milk, some of the foods may make your baby more irritable than usual. Avoid eating these foods if you are sure that they are negatively affecting your baby. °· Drink milk, fruit juice, and water to  satisfy your thirst (about 10 glasses a day). °· Rest often, relax, and continue to take your prenatal vitamins to prevent fatigue, stress, and anemia. °· Continue breast self-awareness checks. °· Avoid chewing and smoking tobacco. Chemicals from cigarettes that pass into breast milk and exposure to secondhand smoke may harm your baby. °· Avoid alcohol and drug use, including marijuana. °Some medicines that may be harmful to your baby can pass through breast milk. It is important to ask your health care provider before taking any medicine, including all over-the-counter and prescription medicine as well as vitamin and herbal supplements. °It is possible to become pregnant while breastfeeding. If birth control is desired, ask your health care provider about options that will be safe for your baby. °Contact a health care provider if: °· You feel like you want to stop breastfeeding or have become frustrated with breastfeeding. °· You have painful breasts or nipples. °· Your nipples are cracked or bleeding. °· Your breasts are red, tender, or warm. °· You have a swollen area on either breast. °· You have a fever or chills. °· You have nausea or vomiting. °· You have drainage other than breast milk from your nipples. °· Your breasts do not become full before feedings by the fifth day after you give birth. °· You feel sad and depressed. °· Your baby is too sleepy to eat well. °· Your baby is having trouble sleeping. °· Your baby is wetting less than 3 diapers in a 24-hour period. °· Your baby has less than 3 stools in a 24-hour period. °· Your baby's skin or the white part of his or her eyes becomes yellow. °· Your baby is not gaining weight by 5 days of age. °Get help right away if: °· Your baby is overly tired (lethargic) and does not want to wake up and feed. °· Your baby develops an unexplained fever. °This information is not intended to replace advice given to you by your health care provider. Make sure you discuss  any questions you have with your health care provider. °Document Released: 01/01/2005 Document Revised: 06/15/2015 Document Reviewed: 06/25/2012 °Elsevier Interactive Patient Education © 2017 [employer] °Home Care Instructions for Mom °ACTIVITY °·   Gradually return to your regular activities. °· Let yourself rest. Nap while your baby sleeps. °· Avoid lifting anything that is heavier than 10 lb (4.5 kg) until your health care provider says it is okay. °· Avoid activities that take a lot of effort and energy (are strenuous) until approved by your health care provider. Walking at a slow-to-moderate pace is usually safe. °· If you had a cesarean delivery: °? Do not vacuum, climb stairs, or drive a car for 4-6 weeks. °? Have someone help you at home until you feel like you can do your usual activities yourself. °? Do exercises as told by your health care provider, if this applies. ° °VAGINAL BLEEDING °You may continue to bleed for 4-6 weeks after delivery. Over time, the amount of blood usually decreases and the color of the blood usually gets lighter. However, the flow of bright red blood may increase if you have been too active. If you need to use more than one pad in an hour because your pad gets soaked, or if you pass a large clot: °· Lie down. °· Raise your feet. °· Place a cold compress on your lower abdomen. °· Rest. °· Call your health care provider. ° °If you are breastfeeding, your period should return anytime between 8 weeks after delivery and the time that you stop breastfeeding. If you are not breastfeeding, your period should return 6-8 weeks after delivery. °PERINEAL CARE °The perineal area, or perineum, is the part of your body between your thighs. After delivery, this area needs special care. Follow these instructions as told by your health care provider. °· Take warm tub baths for 15-20 minutes. °· Use medicated pads and pain-relieving sprays and creams as told. °· Do not use tampons or douches until  vaginal bleeding has stopped. °· Each time you go to the bathroom: °? Use a peri bottle. °? Change your pad. °? Use towelettes in place of toilet paper until your stitches have healed. °· Do Kegel exercises every day. Kegel exercises help to maintain the muscles that support the vagina, bladder, and bowels. You can do these exercises while you are standing, sitting, or lying down. To do Kegel exercises: °? Tighten the muscles of your abdomen and the muscles that surround your birth canal. °? Hold for a few seconds. °? Relax. °? Repeat until you have done this 5 times in a row. °· To prevent hemorrhoids from developing or getting worse: °? Drink enough fluid to keep your urine clear or pale yellow. °? Avoid straining when having a bowel movement. °? Take over-the-counter medicines and stool softeners as told by your health care provider. ° °BREAST CARE °· Wear a tight-fitting bra. °· Avoid taking over-the-counter pain medicine for breast discomfort. °· Apply ice to the breasts to help with discomfort as needed: °? Put ice in a plastic bag. °? Place a towel between your skin and the bag. °? Leave the ice on for 20 minutes or as told by your health care provider. ° °NUTRITION °· Eat a well-balanced diet. °· Do not try to lose weight quickly by cutting back on calories. °· Take your prenatal vitamins until your postpartum checkup or until your health care provider tells you to stop. ° °POSTPARTUM DEPRESSION °You may find yourself crying for no apparent reason and unable to cope with all of the changes that come with having a newborn. This mood is called postpartum depression. Postpartum depression happens because your hormone levels change after delivery. If you have   postpartum depression, get support from your partner, friends, and family. If the depression does not go away on its own after several weeks, contact your health care provider. °BREAST SELF-EXAM °Do a breast self-exam each month, at the same time of the  month. If you are breastfeeding, check your breasts just after a feeding, when your breasts are less full. If you are breastfeeding and your period has started, check your breasts on day 5, 6, or 7 of your period. °Report any lumps, bumps, or discharge to your health care provider. Know that breasts are normally lumpy if you are breastfeeding. This is temporary, and it is not a health risk. °INTIMACY AND SEXUALITY °Avoid sexual activity for at least 3-4 weeks after delivery or until the brownish-red vaginal flow is completely gone. If you want to avoid pregnancy, use some form of birth control. You can get pregnant after delivery, even if you have not had your period. °SEEK MEDICAL CARE IF: °· You feel unable to cope with the changes that a child brings to your life, and these feelings do not go away after several weeks. °· You notice a lump, a bump, or discharge on your breast. ° °SEEK IMMEDIATE MEDICAL CARE IF: °· Blood soaks your pad in 1 hour or less. °· You have: °? Severe pain or cramping in your lower abdomen. °? A bad-smelling vaginal discharge. °? A fever that is not controlled by medicine. °? A fever, and an area of your breast is red and sore. °? Pain or redness in your calf. °? Sudden, severe chest pain. °? Shortness of breath. °? Painful or bloody urination. °? Problems with your vision. °· You vomit for 12 hours or longer. °· You develop a severe headache. °· You have serious thoughts about hurting yourself, your child, or anyone else. ° °This information is not intended to replace advice given to you by your health care provider. Make sure you discuss any questions you have with your health care provider. °Document Released: 12/30/1999 Document Revised: 06/09/2015 Document Reviewed: 07/05/2014 °Elsevier Interactive Patient Education © 2017 [employer] ° °

## 2016-08-09 NOTE — Lactation Note (Signed)
This note was copied from a baby's chart. Lactation Consultation Note: Mother reports that she breastfed her 8 month old until a few days ago. Mother reports that infant is breastfeeding well. She was reminded to use good neck, head and shoulder support when breastfeeding infant .  Mother advised to cue base feed infant and hand express before and after feedings. Mother verifies that she is able to hand express colostrum. Mother was given [facility] brochure with information on all services. Mother receptive to all teaching. Recommend that mother do frequent skin to skin and to feed infant 8-12 times in 24 hours.   Patient Name: Maria Combs [id number] Date: 08/09/2016 Reason for consult: Initial assessment   Maternal Data    Feeding Feeding Type: Breast Fed Length of feed: 6 min (reports per mother)  LATCH Score/Interventions                      Lactation Tools Discussed/Used     Consult Status Consult Status: Follow-up Date: 08/09/16 Follow-up type: In-patient    Jess Barters [facility] 08/09/2016, 1:55 PM

## 2016-08-09 NOTE — Progress Notes (Signed)
Discharge education complete, discharge instructions and follow up appointment discussed. Patient verbalized understanding. 

## 2016-08-09 NOTE — Discharge Summary (Signed)
OB Discharge Summary  Patient Name: Maria Combs DOB: 11/11/1990 MRN: [id number]  Date of admission: 08/08/2016 Delivering MD: SHIRLEY, [place]   Date of discharge: 08/09/2016  Admitting diagnosis: 49 WEEKS ROM CTX Intrauterine pregnancy: [redacted]w[redacted]d     Secondary diagnosis:Active Problems:   Normal labor   SVD (spontaneous vaginal delivery)  Additional problems: +GBS     Discharge diagnosis: Term Pregnancy Delivered                                                                      Augmentation: Pitocin  Complications: None  Hospital course:  Onset of Labor With Vaginal Delivery     26 y.o. yo [id number] at [redacted]w[redacted]d was admitted in Latent Labor on 08/08/2016. Patient had an uncomplicated labor course as follows:  Membrane Rupture Time/Date: 3:00 AM ,08/08/2016   Intrapartum Procedures: Episiotomy: None [1]                                         Lacerations:  None [1]  Patient had a delivery of a Viable infant. 08/08/2016  Information for the patient's newborn:  Tavie, Haseman [facility] [[id number]]  Delivery Method: Vaginal, Spontaneous Delivery (Filed from Delivery Summary)    Pateint had an uncomplicated postpartum course.  She is ambulating, tolerating a regular diet, passing flatus, and urinating well. Patient is discharged home in stable condition on 08/09/16.   Physical exam  Vitals:   08/08/16 1630 08/08/16 1715 08/08/16 2246 08/09/16 0615  BP: 105/69 112/75 (!) 110/59 102/60  Pulse: 73 85 99 73  Resp: 18 18  17   Temp:  98.2 F (36.8 C) 98.6 F (37 C) 98 F (36.7 C)  TempSrc:  Axillary Oral Oral  SpO2:  99%    Weight:      Height:       General: alert Lochia: appropriate Uterine Fundus: firm and NT at U-1 Incision: N/A DVT Evaluation: No evidence of DVT seen on physical exam. Labs: Lab Results  Component Value Date   WBC 9.7 08/08/2016   HGB 10.2 (L) 08/08/2016   HCT 32.6 (L) 08/08/2016   MCV 76.9 (L) 08/08/2016   PLT 249 08/08/2016   CMP Latest  Ref Rng & Units 11/16/2015  Glucose 65 - 99 mg/dL 76  BUN 7 - 25 mg/dL 14  Creatinine 0.50 - 1.10 mg/dL 0.59  Sodium 135 - 146 mmol/L 140  Potassium 3.5 - 5.3 mmol/L 4.4  Chloride 98 - 110 mmol/L 103  CO2 20 - 31 mmol/L 26  Calcium 8.6 - 10.2 mg/dL 9.2  Total Protein 6.1 - 8.1 g/dL 7.9  Total Bilirubin 0.2 - 1.2 mg/dL 0.3  Alkaline Phos 33 - 115 U/L 82  AST 10 - 30 U/L 20  ALT 6 - 29 U/L 13    Discharge instruction: per After Visit Summary and "Baby and Me Booklet".  After Visit Meds:  Allergies as of 08/09/2016   No Known Allergies     Medication List    TAKE these medications   albuterol 108 (90 Base) MCG/ACT inhaler Commonly known as:  PROVENTIL HFA;VENTOLIN HFA Inhale 2 puffs into the  lungs every 4 (four) hours as needed for wheezing or shortness of breath.   ondansetron 4 MG disintegrating tablet Commonly known as:  ZOFRAN-ODT Take 1 tablet (4 mg total) by mouth every 8 (eight) hours as needed for nausea or vomiting.   PRENATAL PO Take 1 tablet by mouth daily.       Diet: routine diet  Activity: Advance as tolerated. Pelvic rest for 6 weeks.   Outpatient follow up:4 weeks Follow up Appt:Future Appointments Date Time Provider [place]  08/15/2016 1:55 PM Sela Hilding, MD [facility] [place]   Follow up visit: No Follow-up on file.  Postpartum contraception: Depo Provera  Newborn Data: Live born female  Birth Weight: 9 lb 0.3 oz (4090 g) APGAR: 8, 9  Baby Feeding: Breast Disposition:home with mother   08/09/2016 Emily Filbert, MD

## 2016-08-09 NOTE — Anesthesia Postprocedure Evaluation (Signed)
Anesthesia Post Note  Patient: Maria Combs  Procedure(s) Performed: * No procedures listed *     Patient location during evaluation: Mother Baby Anesthesia Type: Epidural Level of consciousness: awake, awake and alert, oriented and patient cooperative Pain management: pain level controlled Vital Signs Assessment: post-procedure vital signs reviewed and stable Respiratory status: spontaneous breathing, nonlabored ventilation and respiratory function stable Cardiovascular status: stable Postop Assessment: no headache, no backache, patient able to bend at knees and no signs of nausea or vomiting Anesthetic complications: no    Last Vitals:  Vitals:   08/08/16 2246 08/09/16 0615  BP: (!) 110/59 102/60  Pulse: 99 73  Resp:  17  Temp: 37 C 36.7 C    Last Pain:  Vitals:   08/09/16 0615  TempSrc: Oral  PainSc: 3    Pain Goal:                 Yesika Rispoli L

## 2016-08-09 NOTE — Progress Notes (Signed)
POSTPARTUM PROGRESS NOTE  Post Partum Day 2 Subjective:  Maria Combs is a 26 y.o. [id number] [redacted]w[redacted]d s/p NSVD.  No acute events overnight.  Pt denies problems with ambulating, voiding or po intake.  She denies nausea or vomiting.  Pain is well controlled.  She has had flatus. She has had bowel movement.  Lochia Small.   Objective: Blood pressure 102/60, pulse 73, temperature 98 F (36.7 C), temperature source Oral, resp. rate 17, height 5\' 6"  (1.676 m), weight 81.6 kg (180 lb), last menstrual period 11/09/2015, SpO2 99 %, unknown if currently breastfeeding.  Physical Exam:  General: alert, cooperative and no distress Chest: CTAB Heart: RRR no m/r/g Abdomen: +BS, soft, nontender,  Uterine Fundus: firm, DVT Evaluation: No calf swelling or tenderness Extremities:no edema   Recent Labs  08/08/16 0445  HGB 10.2*  HCT 32.6*    Assessment/Plan:  ASSESSMENT: Maria Combs is a 26 y.o. H6D1497 [redacted]w[redacted]d s/p NSVD  Discharge home   LOS: 1 day   Rodney Booze, Medical Student

## 2016-08-15 ENCOUNTER — Ambulatory Visit (INDEPENDENT_AMBULATORY_CARE_PROVIDER_SITE_OTHER): Payer: Medicaid Other | Admitting: Family Medicine

## 2016-08-15 ENCOUNTER — Encounter: Payer: Medicaid Other | Admitting: Family Medicine

## 2016-08-15 ENCOUNTER — Encounter: Payer: Self-pay | Admitting: Family Medicine

## 2016-08-15 VITALS — BP 108/70 | HR 87 | Temp 98.8°F | Ht 66.0 in | Wt 160.0 lb

## 2016-08-15 DIAGNOSIS — Z3009 Encounter for other general counseling and advice on contraception: Secondary | ICD-10-CM | POA: Diagnosis present

## 2016-08-15 DIAGNOSIS — Z30013 Encounter for initial prescription of injectable contraceptive: Secondary | ICD-10-CM | POA: Diagnosis not present

## 2016-08-15 DIAGNOSIS — Z309 Encounter for contraceptive management, unspecified: Secondary | ICD-10-CM | POA: Insufficient documentation

## 2016-08-15 MED ORDER — DOCUSATE SODIUM 100 MG PO CAPS: 100.0000 mg | ORAL_CAPSULE | Freq: Two times a day (BID) | ORAL | 0 refills | Status: AC

## 2016-08-15 MED ORDER — MEDROXYPROGESTERONE ACETATE 150 MG/ML IM SUSY
150.0000 mg | PREFILLED_SYRINGE | Freq: Once | INTRAMUSCULAR | Status: AC
  Administered 2016-08-15: 150 mg via INTRAMUSCULAR

## 2016-08-15 NOTE — Patient Instructions (Signed)
It was a pleasure to see you today! Thank you for choosing [facility] for your primary care. Maria Combs was seen for blood pressure, post partum, depo.   Our plans for today were:  Come back in 3 months.   Make a 6 week postpartum checkup.   Keep doing the miralax as needed, and add the colace stool softener.   Best,  Dr. Lindell Noe

## 2016-08-15 NOTE — Progress Notes (Signed)
   CC: post partum  HPI Right side pain - stabbing, trying Tylenol for both this and general post partum pain. No fevers. No excess bleeding. Lasts 5-10 minutes. Goes away. No change with pressure. No change with eating or drinking. Can be in the shower or laying down, no change. Breastfeeding is going well. Difficult to poop. Tried miralax.   Wants Depo, this worked well for her in the past. No sex since delivery.   CC, SH/smoking status, and VS noted  Objective: BP 108/70   Pulse 87   Temp 98.8 F (37.1 C) (Oral)   Ht 5\' 6"  (1.676 m)   Wt 160 lb (72.6 kg)   LMP 11/09/2015 (Exact Date)   SpO2 97%   BMI 25.82 kg/m  Gen: NAD, alert, cooperative, and pleasant. HEENT: NCAT, EOMI, PERRL CV: RRR, no murmur Resp: CTAB, no wheezes, non-labored Abd: SNTND, BS present, no guarding or organomegaly Ext: No edema, warm Neuro: Alert and oriented, Speech clear, No gross deficits  Assessment and plan:  Contraception management Patient 1 week postpartum, denies sexual activity. Requested Depo-Provera discharge from women's, but did not get it. Depo given today.  Intermittent Right Abdominal Pain Without systemic symptoms, minimal concern for endometritis. Likely aches and pains after delivery And due to uterine contractions with breast-feeding. Continue Tylenol and ibuprofen. Instructed patient to return if worsening or not going away. Constipation could also be contributed in, instructed patient to continue when necessary MiraLAX use as well as scheduled daily stool softener use as prescribed.  No orders of the defined types were placed in this encounter.   Meds ordered this encounter  Medications  . docusate sodium (COLACE) 100 MG capsule    Sig: Take 1 capsule (100 mg total) by mouth 2 (two) times daily.    Dispense:  30 capsule    Refill:  0  . MedroxyPROGESTERone Acetate SUSY 150 mg    Ralene Ok, MD, PGY2 08/15/2016 4:23 PM

## 2016-08-15 NOTE — Assessment & Plan Note (Signed)
Patient 1 week postpartum, denies sexual activity. Requested Depo-Provera discharge from women's, but did not get it. Depo given today.

## 2016-09-19 ENCOUNTER — Encounter: Payer: Self-pay | Admitting: Family Medicine

## 2016-09-19 ENCOUNTER — Ambulatory Visit (INDEPENDENT_AMBULATORY_CARE_PROVIDER_SITE_OTHER): Payer: Medicaid Other | Admitting: Family Medicine

## 2016-09-19 NOTE — Progress Notes (Signed)
   CC: postpartum check  HPI Mood is doing well. Breastfeeding is going great. Baby is doing well. She notes she craves sugar, thinks this is related to depo. Stopped vaginal bleeding 2 weeks after delivery, but return of menses last week. Has had sex once without pain or bleeding. Notes some vaginal odor with menses, no abnormal discharge. Did note some urethral irritation after delivery, has been doing fine in the last week or so with this.   ROS: denies CP, SOB, abd pain, dysuria, changes in BMs.    CC, SH/smoking status, and VS noted  Objective: BP 130/72   Pulse 91   Temp 98.1 F (36.7 C) (Oral)   Ht 5\' 6"  (1.676 m)   Wt 175 lb 6.4 oz (79.6 kg)   SpO2 99%   BMI 28.31 kg/m  Gen: NAD, alert, cooperative, and pleasant. HEENT: NCAT, EOMI, PERRL CV: RRR, no murmur Resp: CTAB, no wheezes, non-labored Abd: SNTND, BS present, no guarding or organomegaly GU: normal external female genitalia, cervix os visually closed with scant dark red blood in posterior fornix, normal vaginal walls. Urethral opening appears normal.  Ext: No edema, warm Neuro: Alert and oriented, Speech clear, No gross deficits  Assessment and plan:  Postpartum check - cleared for resuming normal activities. Regarding odor, no odor on exam and no abnormal vaginal discharge. Return if worsening.   Ralene Ok, MD, PGY2 09/19/2016 3:55 PM

## 2016-09-19 NOTE — Patient Instructions (Signed)
It was a pleasure to see you today! Thank you for choosing [facility] for your primary care. Maria Combs was seen for postpartum check.   Our plans for today were:  You are cleared for all normal activities.   Do not use fragranced soaps on your genital area as this can make irritation and odor worse.   You are due for your next depo 11/15/16. Please make a nurse visit to get this.  Best,  Dr. Lindell Noe

## 2016-11-14 ENCOUNTER — Ambulatory Visit (INDEPENDENT_AMBULATORY_CARE_PROVIDER_SITE_OTHER): Payer: Medicaid Other | Admitting: *Deleted

## 2016-11-14 DIAGNOSIS — Z23 Encounter for immunization: Secondary | ICD-10-CM | POA: Diagnosis not present

## 2016-11-14 DIAGNOSIS — Z3042 Encounter for surveillance of injectable contraceptive: Secondary | ICD-10-CM

## 2016-11-14 DIAGNOSIS — N912 Amenorrhea, unspecified: Secondary | ICD-10-CM | POA: Diagnosis present

## 2016-11-14 LAB — POCT URINE PREGNANCY: PREG TEST UR: NEGATIVE

## 2016-11-14 MED ORDER — MEDROXYPROGESTERONE ACETATE 150 MG/ML IM SUSY
150.0000 mg | PREFILLED_SYRINGE | Freq: Once | INTRAMUSCULAR | Status: AC
  Administered 2016-11-14: 150 mg via INTRAMUSCULAR

## 2016-11-14 NOTE — Progress Notes (Signed)
   Patient presents for Depo Provera injection States feeling well Last injection received 08/15/2016  Patient is within dates for injection  Patient requesting UPT: Urine pregnancy negative  Depo Provera given IM LUOQ. Patient tolerated well.   Next injection due Jan 16-30, 2019 Reminder card given  Flu vaccine administered today  Iline Buchinger, Orvis Brill, RN

## 2017-02-06 ENCOUNTER — Ambulatory Visit (INDEPENDENT_AMBULATORY_CARE_PROVIDER_SITE_OTHER): Payer: Medicaid Other

## 2017-02-06 DIAGNOSIS — Z3042 Encounter for surveillance of injectable contraceptive: Secondary | ICD-10-CM

## 2017-02-06 MED ORDER — MEDROXYPROGESTERONE ACETATE 150 MG/ML IM SUSY
150.0000 mg | PREFILLED_SYRINGE | Freq: Once | INTRAMUSCULAR | Status: AC
  Administered 2017-02-06: 150 mg via INTRAMUSCULAR

## 2017-02-06 NOTE — Progress Notes (Signed)
    Patient in nurse clinic within dates for injection of Depo Provera. Injection of Depo 150 mg in RUOQGM without adverse effects. Patient verbalized ongoing problem with headaches previously discussed with PCP and also some decreased libido and vaginal odor. Appt made to f/u with PCP on 02/19/2017. Patient will call if needs sooner appt.  Patient due for next Depo between 04/24/2017 and 05/08/2017. Appt reminder given. Danley Danker, RN [facility] RN)

## 2017-02-19 ENCOUNTER — Ambulatory Visit (INDEPENDENT_AMBULATORY_CARE_PROVIDER_SITE_OTHER): Payer: Self-pay | Admitting: Family Medicine

## 2017-02-19 VITALS — BP 100/75 | HR 93 | Temp 98.5°F | Ht 66.0 in | Wt 193.4 lb

## 2017-02-19 DIAGNOSIS — R519 Headache, unspecified: Secondary | ICD-10-CM

## 2017-02-19 DIAGNOSIS — R51 Headache: Secondary | ICD-10-CM

## 2017-02-19 DIAGNOSIS — G43719 Chronic migraine without aura, intractable, without status migrainosus: Secondary | ICD-10-CM

## 2017-02-19 DIAGNOSIS — N898 Other specified noninflammatory disorders of vagina: Secondary | ICD-10-CM

## 2017-02-19 LAB — POCT WET PREP (WET MOUNT)
CLUE CELLS WET PREP WHIFF POC: NEGATIVE
Trichomonas Wet Prep HPF POC: ABSENT

## 2017-02-19 MED ORDER — AMITRIPTYLINE HCL 25 MG PO TABS: 25.0000 mg | ORAL_TABLET | Freq: Every day | ORAL | 1 refills | Status: AC

## 2017-02-19 NOTE — Progress Notes (Signed)
   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 Had HAs during pregnancy, thought these would improve after delivery. States daily dull headaches over the top of her head, but monthly sharp headaches radiating from her neck to entire head. Has tried tylenol without help. Sleep helps. Associated dizziness.   Vaginal odor - x1 week. No OTCs tried. Tried many soaps.   Ran into monkey bars chasing her daughter today. No LOC, thinks she hit her forehead and nose. No bleeding, no lac.   ROS: denies vision changes, CP, change in dizziness, SOB, abd pain, dysuria, changes in BMs.   CC, SH/smoking status, and VS noted  Objective: BP 100/75 (BP Location: Left Arm, Patient Position: Sitting, Cuff Size: Normal)   Pulse 93   Temp 98.5 F (36.9 C) (Oral)   Ht 5\' 6"  (1.676 m)   Wt 193 lb 6.4 oz (87.7 kg)   SpO2 99%   BMI 31.22 kg/m  Gen: NAD, alert, cooperative, and pleasant. HEENT: NCAT, EOMI, PERRL. TTP over L frontal area, nose mildly TTP but not displaced. No sinus tenderness.  CV: RRR, no murmur Resp: CTAB, no wheezes, non-labored Ext: No edema, warm Neuro: Alert and oriented, Speech clear, No gross deficits  Assessment and plan:  Acute head trauma: mild after running into monkey bars. unlikely fractured nose as was patient's concern, exam not consistent. Supportive care at home with tylenol, return if worsening.   Headache disorder Given complex surgical history, referred to headache clinic. Patient unable to appreciate any triggers. She would like to try a suppressive medication. Reviewed Infant Risk database concerning TCAs and discussed with patient - amitriptyline is L2 (category is L1 - L6 as risk stratification), suggesting likely compatible with breastfeeding, limited data available. Patient willing to try this. Can uptitrate as needed.   Vaginal odor:  wet prep negative, counseled patient to try probiotics and decrease use of scented soaps or any soap inside the vagina.   Orders Placed This Encounter  Procedures  . AMB referral to headache clinic    Referral Priority:   Routine    Referral Type:   Consultation    Number of Visits Requested:   1  . POCT Wet Prep [facility])    Meds ordered this encounter  Medications  . amitriptyline (ELAVIL) 25 MG tablet    Sig: Take 1 tablet (25 mg total) by mouth at bedtime.    Dispense:  30 tablet    Refill:  1      Ralene Ok, MD, PGY2 02/22/2017 12:25 PM

## 2017-02-19 NOTE — Patient Instructions (Addendum)
It was a pleasure to see you today! Thank you for choosing [facility] for your primary care. Maria Combs was seen for headache, head injury, vaginal odor.   Our plans for today were:  Try the new medicine for your headaches. Call if you don't hear from headache clinic in 2 weeks.   For your odor, try taking some probiotics and eating more yogurt.   Try ice and tylenol for your head.   Best,  Dr. Lindell Noe

## 2017-02-22 NOTE — Assessment & Plan Note (Addendum)
Given complex surgical history, referred to headache clinic. Patient unable to appreciate any triggers. She would like to try a suppressive medication. Reviewed Infant Risk database concerning TCAs and discussed with patient - amitriptyline is L2 (category is L1 - L6 as risk stratification), suggesting likely compatible with breastfeeding, limited data available. Patient willing to try this. Can uptitrate as needed.

## 2017-03-06 ENCOUNTER — Telehealth: Payer: Self-pay | Admitting: Family Medicine

## 2017-03-06 DIAGNOSIS — R51 Headache: Principal | ICD-10-CM

## 2017-03-06 DIAGNOSIS — R519 Headache, unspecified: Secondary | ICD-10-CM

## 2017-03-06 NOTE — Telephone Encounter (Signed)
Pt called the headache center and was told they dont accept Medicaid. Please send referral to someone that accepts medicaid.

## 2017-03-07 NOTE — Telephone Encounter (Signed)
Placed new referral to neurology.

## 2017-03-08 ENCOUNTER — Encounter: Payer: Self-pay | Admitting: Neurology

## 2017-05-10 ENCOUNTER — Encounter: Payer: Self-pay | Admitting: Neurology

## 2017-06-25 ENCOUNTER — Ambulatory Visit (INDEPENDENT_AMBULATORY_CARE_PROVIDER_SITE_OTHER): Payer: Self-pay | Admitting: Neurology

## 2017-06-25 ENCOUNTER — Encounter: Payer: Self-pay | Admitting: Neurology

## 2017-06-25 VITALS — BP 106/64 | HR 82 | Ht 66.0 in | Wt 178.0 lb

## 2017-06-25 DIAGNOSIS — G43109 Migraine with aura, not intractable, without status migrainosus: Secondary | ICD-10-CM

## 2017-06-25 DIAGNOSIS — IMO0002 Reserved for concepts with insufficient information to code with codable children: Secondary | ICD-10-CM

## 2017-06-25 DIAGNOSIS — M8500 Fibrous dysplasia (monostotic), unspecified site: Secondary | ICD-10-CM

## 2017-06-25 DIAGNOSIS — G43709 Chronic migraine without aura, not intractable, without status migrainosus: Secondary | ICD-10-CM

## 2017-06-25 NOTE — Progress Notes (Signed)
NEUROLOGY CONSULTATION NOTE  SHAMAR KRACKE MRN: [id number] DOB: October 11, 1990  Referring provider: Dr. Lindell Noe Primary care provider: Dr. Lindell Noe  Reason for consult:  headache  HISTORY OF PRESENT ILLNESS: Maria Combs is a 27 year old right-handed female who presents for headaches.  History supplemented by referring provider's note.  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 Location:  Top of head/crown Quality:  Stabbing, pressure She also reports allodynia over the skin of her right temple where the plate was placed. Intensity:  Moderate to severe.  She denies new headache, thunderclap headache or severe headache that wakes her from sleep. Aura:  Phantosmia (smells smoke) Prodrome:  no Postdrome:  no Associated symptoms:  Nausea, photophobia, blurred vision.  She denies associated unilateral numbness or weakness. Duration:  3 hours to 2 days (usually 5-6 hours) Frequency:  Daily (severe headaches every 2-3 days) Frequency of abortive medication: 2 to 3 days a week Triggers/exacerbating factors:  unknown Relieving factors:  sleep Activity:  aggravates  Current NSAIDS:  Advil Current analgesics:  Tylenol Current triptans:  no Current anti-emetic:  no Current muscle relaxants:  no Current anti-anxiolytic:  no Current sleep aide:  no Current Antihypertensive medications:  no Current Antidepressant medications:  no Current Anticonvulsant medications:  no Current Vitamins/Herbal/Supplements:  Prenatal vitamins Current Antihistamines/Decongestants:  no Other therapy:  no  Past NSAIDS:  no Past analgesics:  no Past abortive triptans:  sumatriptan 50mg  Past muscle relaxants:  no Past anti-emetic:  no Past antihypertensive medications:  no Past antidepressant medications:  Amitriptyline 25mg  (anxiety) Past anticonvulsant medications:  no Past  vitamins/Herbal/Supplements:  no Past antihistamines/decongestants:  no Other past therapies:  no  Caffeine:  2 to 4 caffeinated beverages a week Alcohol:  no Smoker:  no Diet:  hydrates Exercise:  Not routine Depression:  Yes; Anxiety:  Yes Other pain:  no Sleep hygiene:  Poor She is currently breastfeeding Family history of headache:  no  PAST MEDICAL HISTORY: Past Medical History:  Diagnosis Date  . Asthma    inhaler prn, last used Sept. 2016  . Brain tumor (benign) ([place])   . Fibrous dysplasia of bone     PAST SURGICAL HISTORY: Past Surgical History:  Procedure Laterality Date  . cranial surgery    . WISDOM TOOTH EXTRACTION Bilateral 01/16/2008    MEDICATIONS: Current Outpatient Medications on File Prior to Visit  Medication Sig Dispense Refill  . albuterol (PROVENTIL HFA;VENTOLIN HFA) 108 (90 BASE) MCG/ACT inhaler Inhale 2 puffs into the lungs every 4 (four) hours as needed for wheezing or shortness of breath. (Patient not taking: Reported on 06/25/2017) 1 Inhaler 3  . amitriptyline (ELAVIL) 25 MG tablet Take 1 tablet (25 mg total) by mouth at bedtime. (Patient not taking: Reported on 06/25/2017) 30 tablet 1  . docusate sodium (COLACE) 100 MG capsule Take 1 capsule (100 mg total) by mouth 2 (two) times daily. (Patient not taking: Reported on 06/25/2017) 30 capsule 0  . Prenatal Vit-Fe Fumarate-FA (PRENATAL PO) Take 1 tablet by mouth daily.    . [DISCONTINUED] clindamycin-benzoyl peroxide (BENZACLIN) gel Apply topically 2 (two) times daily. to affected areas for acne     . [DISCONTINUED] Eflornithine HCl (VANIQA) 13.9 % cream Apply topically 2 (two) times daily. to affected area at least 8 hours apart     . [DISCONTINUED] Etonogestrel (IMPLANON) 68 MG IMPL Inject into the skin.  No current facility-administered medications on file prior to visit.     ALLERGIES: No Known Allergies  FAMILY HISTORY: Family History  Problem Relation Age of Onset  . Asthma Mother     . Asthma Sister   . Hypertension Paternal Grandmother   . Diabetes Paternal Grandmother   . Heart disease Paternal Grandfather     SOCIAL HISTORY: Social History   Socioeconomic History  . Marital status: Single    Spouse name: Not on file  . Number of children: 2  . Years of education: Not on file  . Highest education level: Bachelor's degree (e.g., BA, AB, BS)  Occupational History    Employer: UNEMPLOYED  Social Needs  . Financial resource strain: Not on file  . Food insecurity:    Worry: Not on file    Inability: Not on file  . Transportation needs:    Medical: Not on file    Non-medical: Not on file  Tobacco Use  . Smoking status: Never Smoker  . Smokeless tobacco: Never Used  Substance and Sexual Activity  . Alcohol use: No  . Drug use: No  . Sexual activity: Yes    Birth control/protection: None  Lifestyle  . Physical activity:    Days per week: Not on file    Minutes per session: Not on file  . Stress: Not on file  Relationships  . Social connections:    Talks on phone: Not on file    Gets together: Not on file    Attends religious service: Not on file    Active member of club or organization: Not on file    Attends meetings of clubs or organizations: Not on file    Relationship status: Not on file  . Intimate partner violence:    Fear of current or ex partner: Not on file    Emotionally abused: Not on file    Physically abused: Not on file    Forced sexual activity: Not on file  Other Topics Concern  . Not on file  Social History Narrative   Pt is single, lives with her 2 children in a 2 story house. She Drinks caffeine 3-4 x week. She is active at work.    REVIEW OF SYSTEMS: Constitutional: No fevers, chills, or sweats, no generalized fatigue, change in appetite Eyes: No visual changes, double vision, eye pain Ear, nose and throat: No hearing loss, ear pain, nasal congestion, sore throat Cardiovascular: No chest pain, palpitations Respiratory:   No shortness of breath at rest or with exertion, wheezes GastrointestinaI: No nausea, vomiting, diarrhea, abdominal pain, fecal incontinence Genitourinary:  No dysuria, urinary retention or frequency Musculoskeletal:  No neck pain, back pain Integumentary: No rash, pruritus, skin lesions Neurological: as above Psychiatric: depression, anxiety Endocrine: No palpitations, fatigue, diaphoresis, mood swings, change in appetite, change in weight, increased thirst Hematologic/Lymphatic:  No purpura, petechiae. Allergic/Immunologic: no itchy/runny eyes, nasal congestion, recent allergic reactions, rashes  PHYSICAL EXAM: Vitals:   06/25/17 1607  BP: 106/64  Pulse: 82  SpO2: 98%   General: No acute distress.  Patient appears well-groomed.  Head:  Slight convexity over right temple region Eyes:  fundi examined but not visualized Neck: supple, no paraspinal tenderness, full range of motion Back: No paraspinal tenderness Heart: regular rate and rhythm Lungs: Clear to auscultation bilaterally. Vascular: No carotid bruits. Neurological Exam: Mental status: alert and oriented to person, place, and time, recent and remote memory intact, fund of knowledge intact, attention and concentration intact, speech fluent and  not dysarthric, language intact. Cranial nerves: CN I: not tested CN II: pupils equal, round and reactive to light, visual fields intact CN III, IV, VI:  full range of motion, no nystagmus, no ptosis CN V: facial sensation intact CN VII: upper and lower face symmetric CN VIII: hearing intact CN IX, X: gag intact, uvula midline CN XI: sternocleidomastoid and trapezius muscles intact CN XII: tongue midline Bulk & Tone: normal, no fasciculations. Motor:  5/5 throughout  Sensation: temperature and vibration sensation intact. Deep Tendon Reflexes:  2+ throughout, toes downgoing.  Finger to nose testing:  Without dysmetria.  Heel to shin:  Without dysmetria.  Gait:  Normal station  and stride.  Able to turn and tandem walk. Romberg negative.  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   PLAN: She is concerned that possibly her plate has shifted.  Due to concerns, we will check CT of head.  Further recommendations pending results.  As she is currently breastfeeding, medical management is limited.  She did not tolerate amitriptyline.  We can consider propranolol but her blood pressure is low at baseline.  I would not start topiramate.  At this time, she is not interested in daily preventative medication anyway.  She should limit pain relievers to no more than 2 days out of week to prevent rebound headache.  Keep headache diary.  With approval of her son's pediatrician, consider magnesium citrate 400mg , riboflavin 400mg  and CoQ10 100mg  three times daily.  Follow up as needed.  Thank you for allowing me to take part in the care of this patient.  40 minutes spent face to face with patient, over 50% spent discussing management.  Metta Clines, DO  CC: Sela Hilding, MD

## 2017-06-25 NOTE — Patient Instructions (Addendum)
1.  We will check CT of head to evaluate brain and the plate. 2.  Otherwise, limit use of pain relievers to no more than 2 days out of week to prevent rebound headache 3.  If okay with your son's pediatrician, consider magnesium citrate 400mg  daily, riboflavin 400mg  daily and Coenzyme Q10 100mg  three times daily 4.  Follow up as needed  We have sent a referral to [facility] for your CT and they will call you directly to schedule your appt. They are located at [address]. If you need to contact them directly please call [phone].

## 2017-07-05 ENCOUNTER — Ambulatory Visit
Admission: RE | Admit: 2017-07-05 | Discharge: 2017-07-05 | Disposition: A | Discharge status: Home / Self Care | Payer: No Typology Code available for payment source | Source: Ambulatory Visit | Attending: Neurology | Admitting: Neurology

## 2017-07-05 DIAGNOSIS — G43109 Migraine with aura, not intractable, without status migrainosus: Secondary | ICD-10-CM

## 2017-07-05 DIAGNOSIS — M8500 Fibrous dysplasia (monostotic), unspecified site: Secondary | ICD-10-CM

## 2017-07-10 ENCOUNTER — Telehealth: Payer: Self-pay

## 2017-07-10 NOTE — Telephone Encounter (Signed)
-----   Message from [place], DO sent at 07/08/2017  7:33 AM EDT ----- You can let pt know that nothing new on CT brain when compared to scan 2 years ago

## 2017-07-10 NOTE — Telephone Encounter (Signed)
Called Pt, LMOVM asking her to return my call

## 2017-07-11 ENCOUNTER — Telehealth: Payer: Self-pay | Admitting: Neurology

## 2017-07-11 NOTE — Telephone Encounter (Signed)
Called Pt. I advised her I had called and LMOVM yesterday concerning her CT results, however, I am unable to leave those results on her VM as there is nothing in her chart indicating I have permission to do so.

## 2017-07-11 NOTE — Telephone Encounter (Signed)
Patient wants the results of the CT scan please call

## 2017-07-12 NOTE — Telephone Encounter (Signed)
Pt rtrnd call, advised her of CT results

## 2018-07-22 IMAGING — US US OB COMP LESS 14 WK
1 series · 15 of 28 positions shown · non-contrast
Comparison: None.

CLINICAL DATA: Pelvic pain. Gestational age by LMP of 9 weeks 0
days.

EXAM:
OBSTETRIC <14 WK US AND TRANSVAGINAL OB US
TECHNIQUE: Both transabdominal and transvaginal ultrasound examinations were
performed for complete evaluation of the gestation as well as the
maternal uterus, adnexal regions, and pelvic cul-de-sac.
Transvaginal technique was performed to assess early pregnancy.

[Series 1: us ob comp less 14 wk · 15 of 78 slices shown]
[im 1/78]
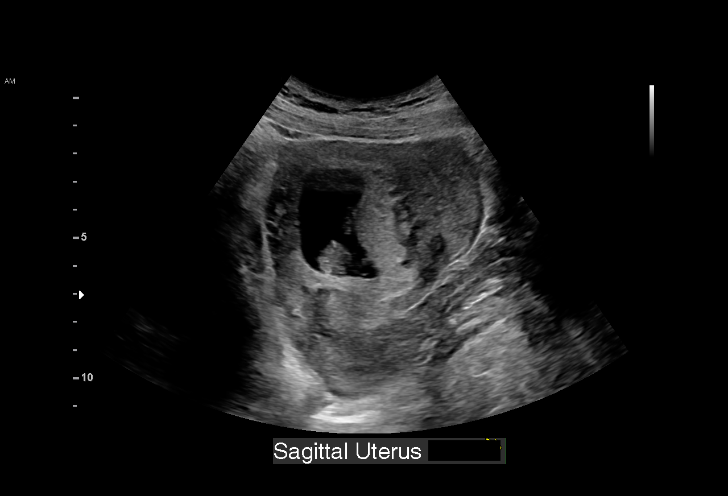
[im 6/78]
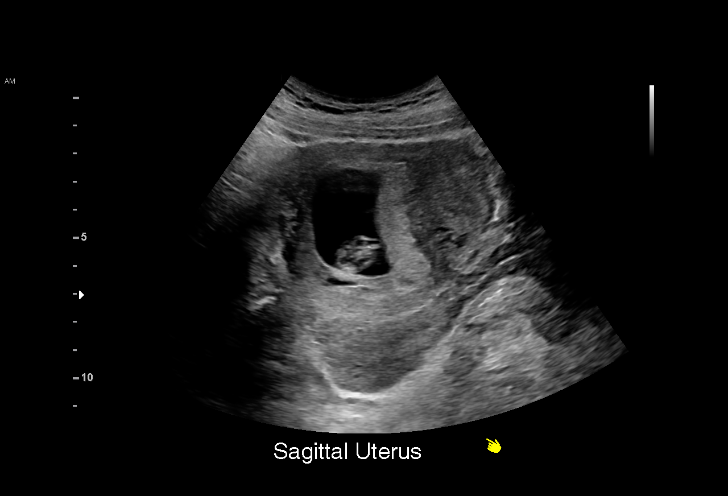
[im 12/78]
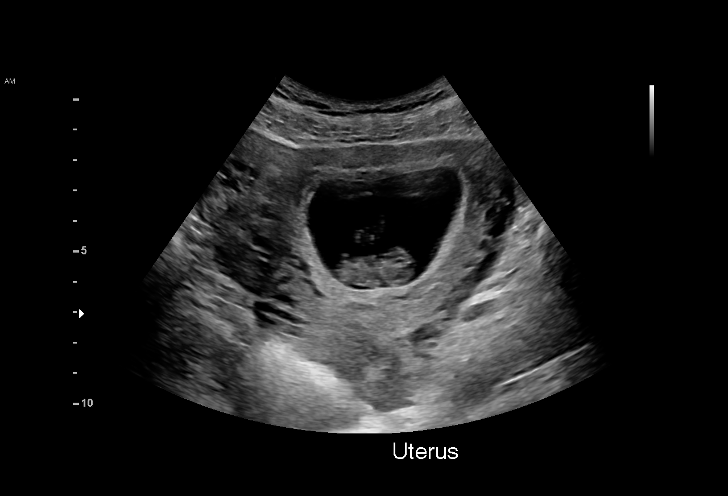
[im 18/78]
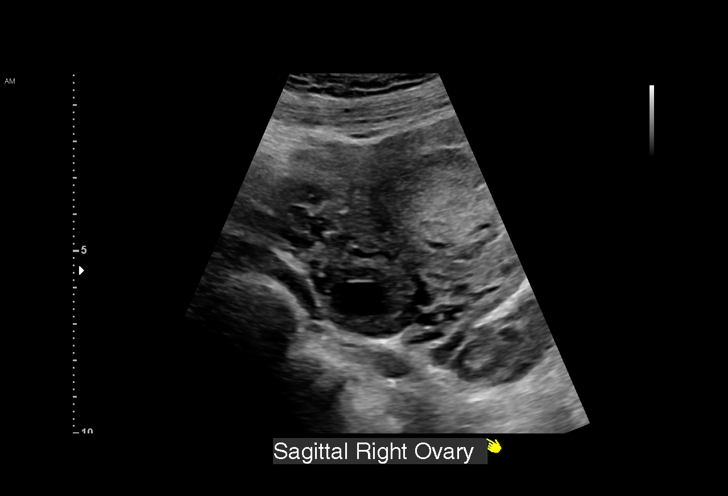
[im 23/78]
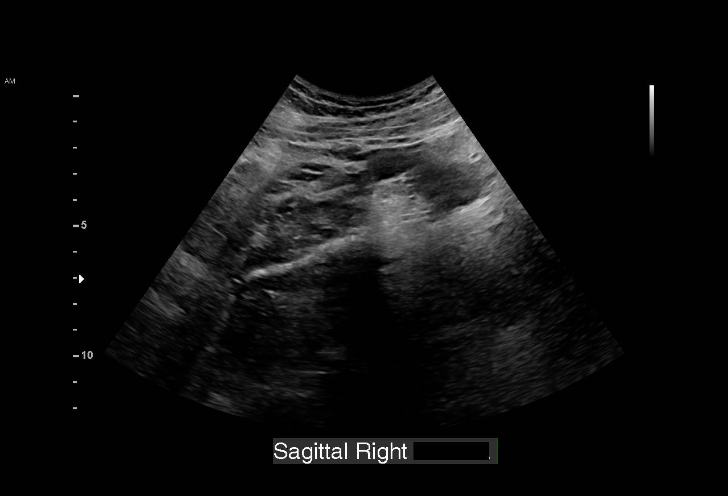
[im 29/78]
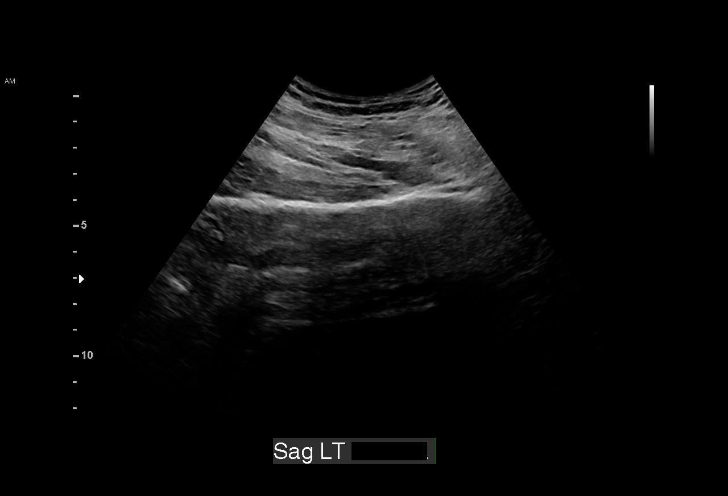
[im 35/78]
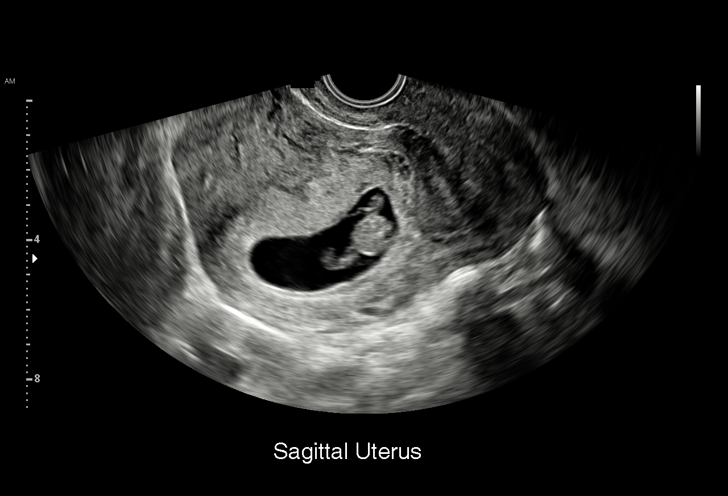
[im 40/78]
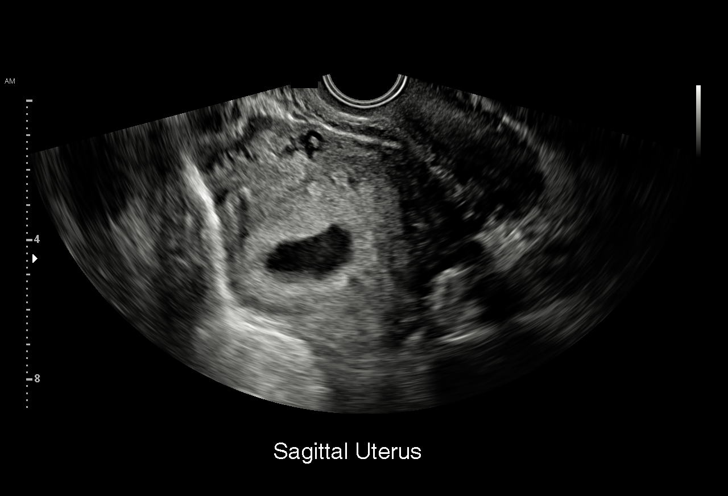
[im 43/78]
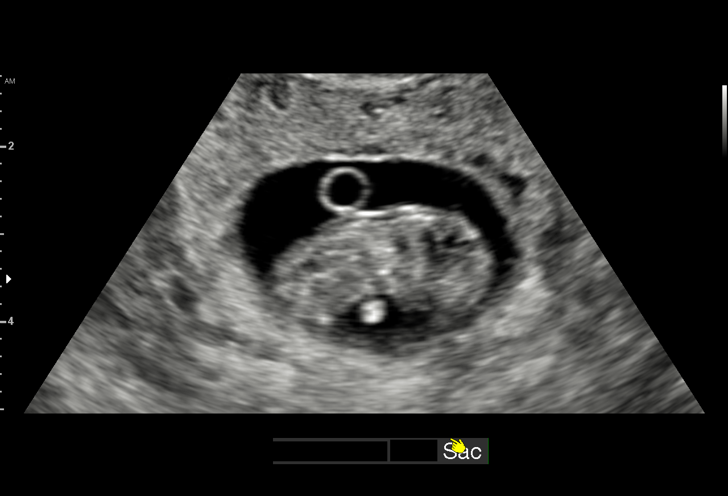
[im 49/78]
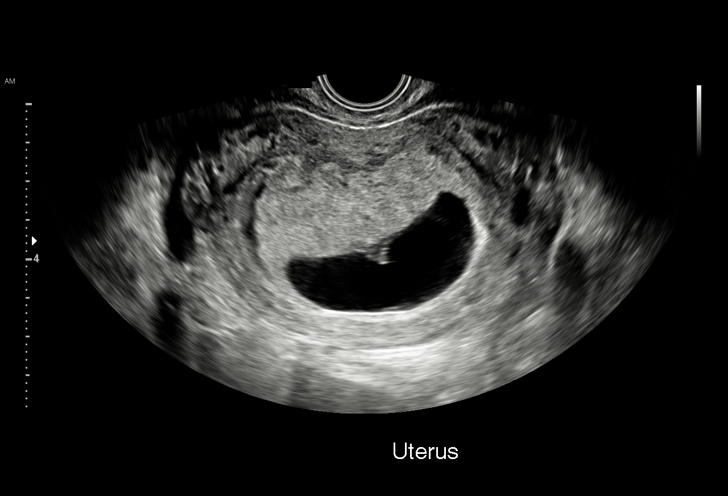
[im 55/78]
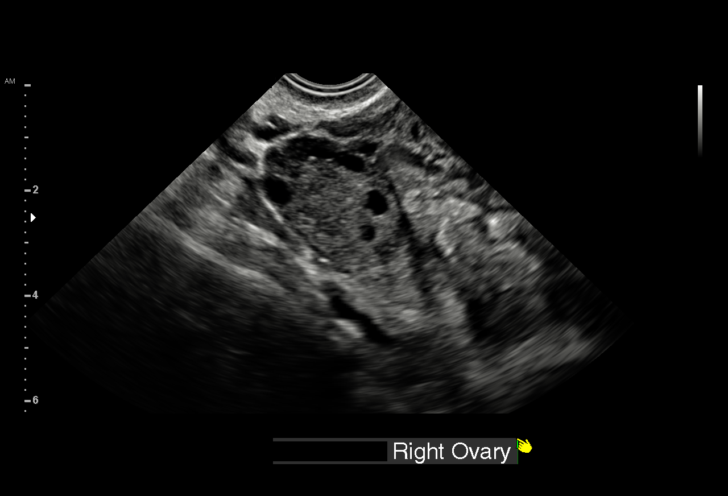
[im 60/78]
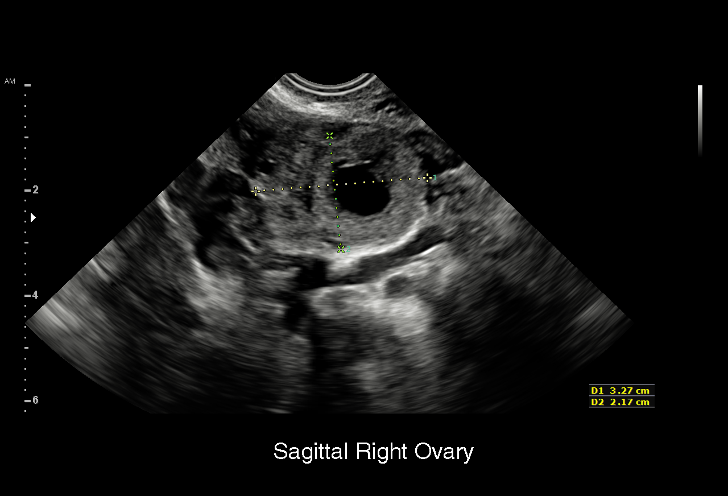
[im 66/78]
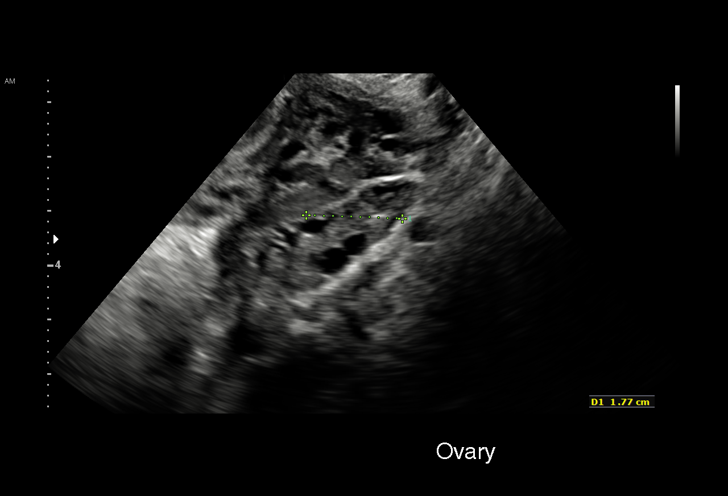
[im 72/78]
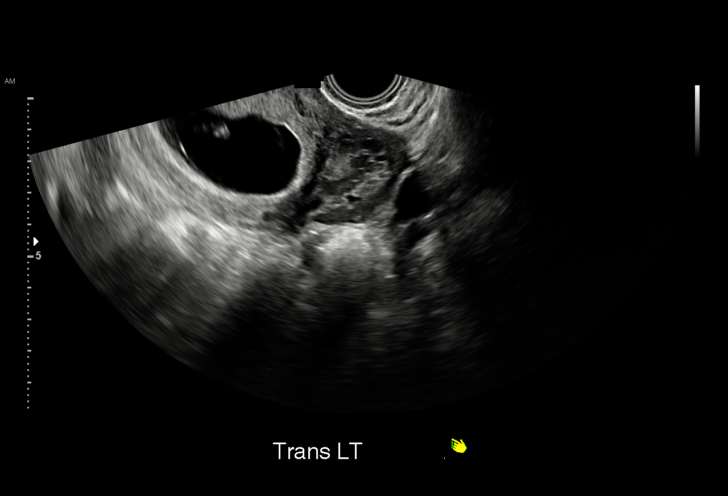
[im 78/78]
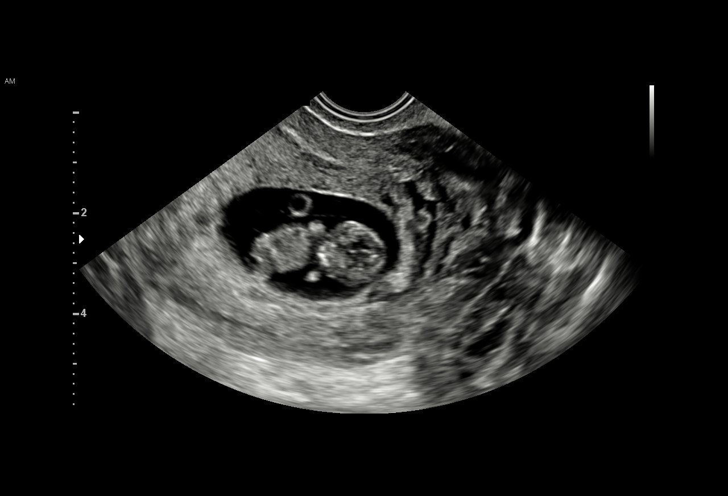

[15 of 28 positions shown; findings below may reference images not displayed]

FINDINGS: Intrauterine gestational sac: Single

Yolk sac:  Visualized.

Embryo:  Visualized.

Cardiac Activity: Visualized.

Heart Rate: 186  bpm

CRL:  28  mm   9 w   4 d                  US EDC: 08/11/2016

Subchorionic hemorrhage:  None visualized.

Maternal uterus/adnexae: Both ovaries are normal in appearance. No
mass or free fluid identified.
IMPRESSION: Single living IUP with estimated gestational age of 9 weeks 4 days.
This is concordant with LMP.

No significant maternal uterine or adnexal abnormality identified.

## 2018-08-19 IMAGING — US US MFM FETAL NUCHAL TRANSLUCENCY
1 series · 14 of 28 positions shown · non-contrast
Comparison: none

[Series 1: us mfm fetal nuchal translucency · 52 acquisitions, 14 frames shown]
[im 2/52]
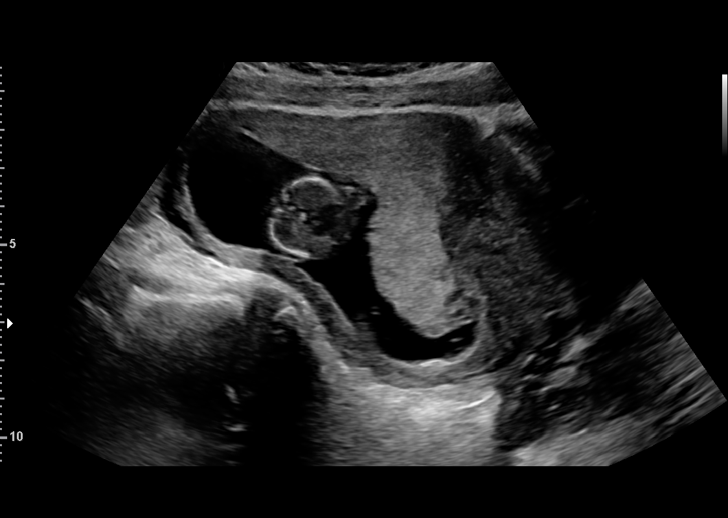
[im 6/52]
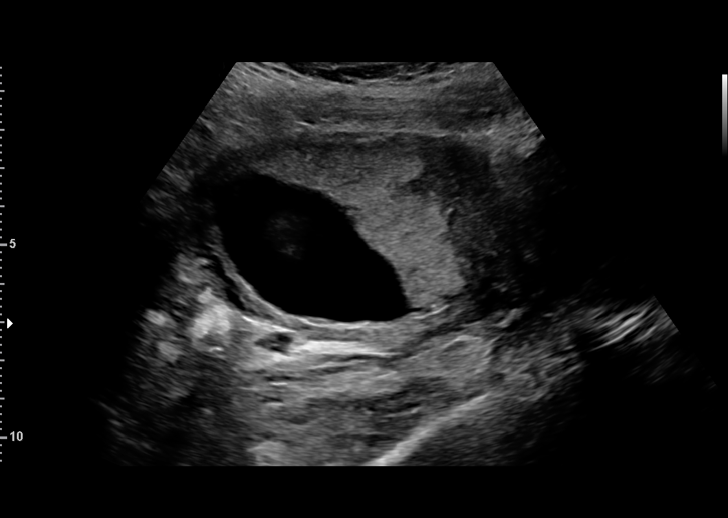
[im 10/52]
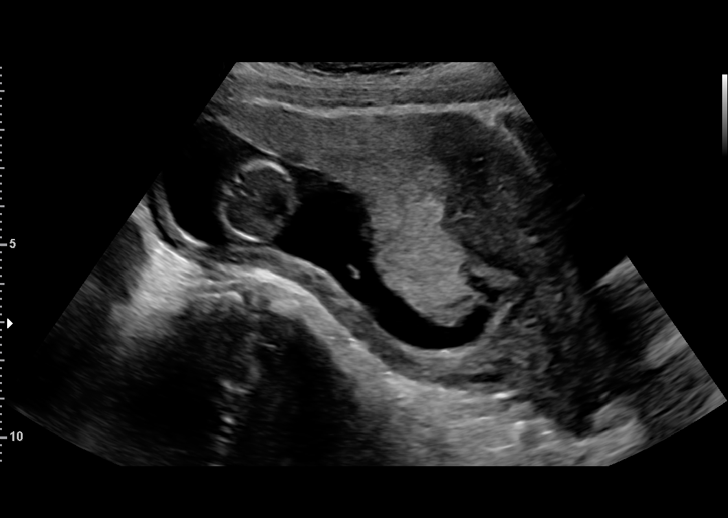
[im 14/52]
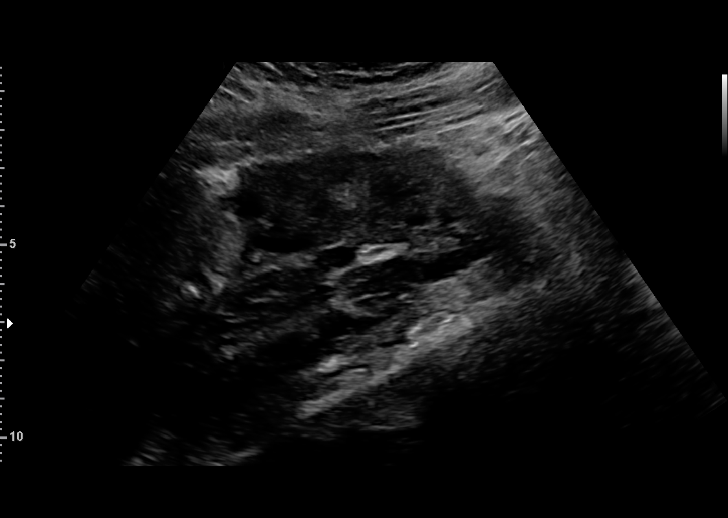
[im 18/52]
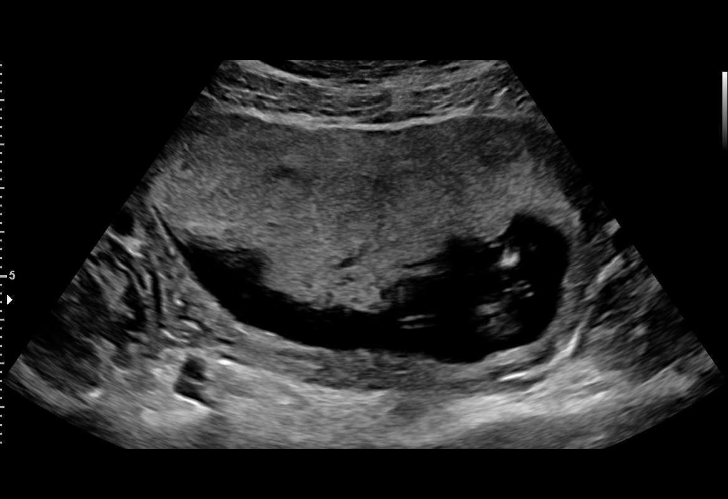
[im 21/52]
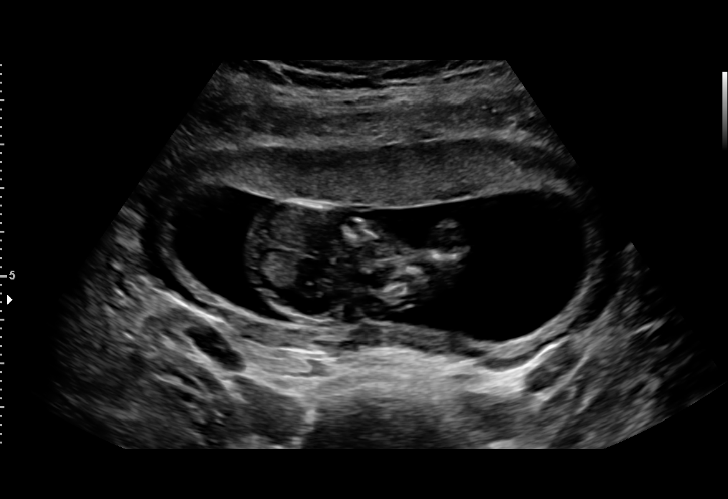
[im 25/52]
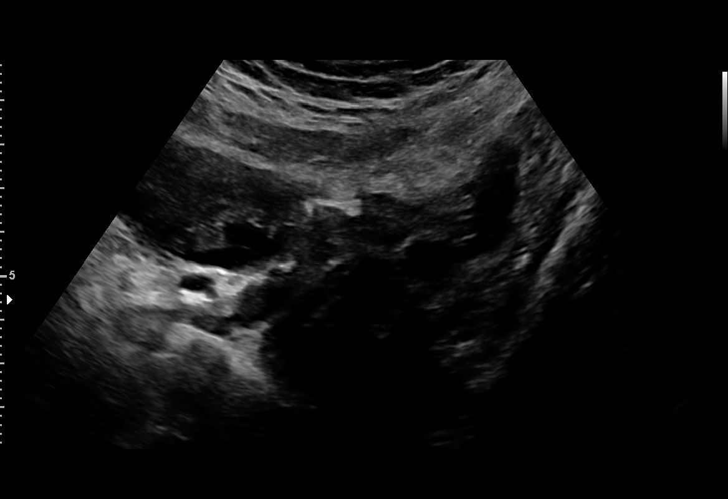
[im 29/52]
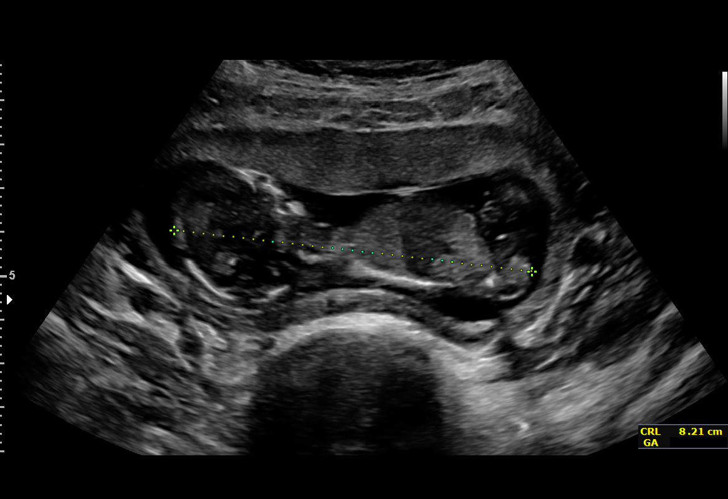
[im 33/52]
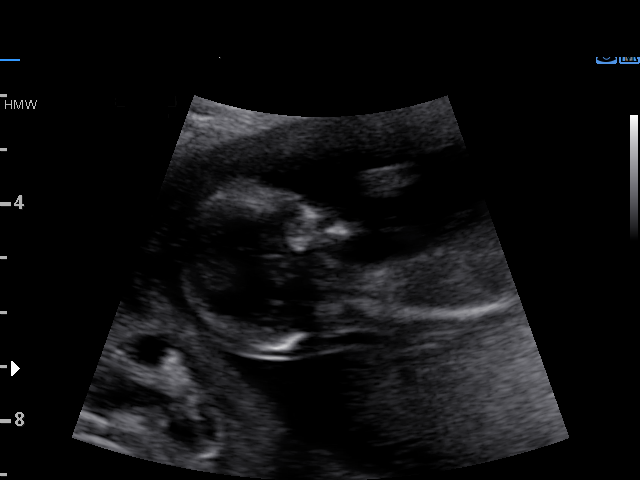
[im 36/52]
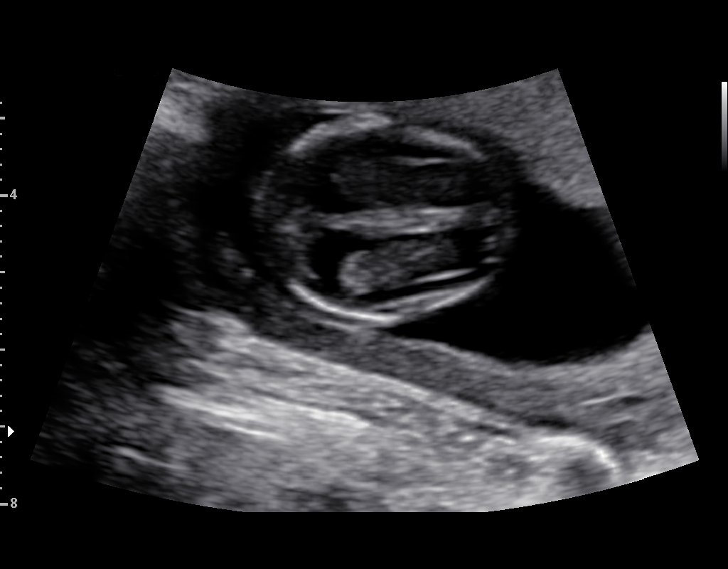
[im 40/52]
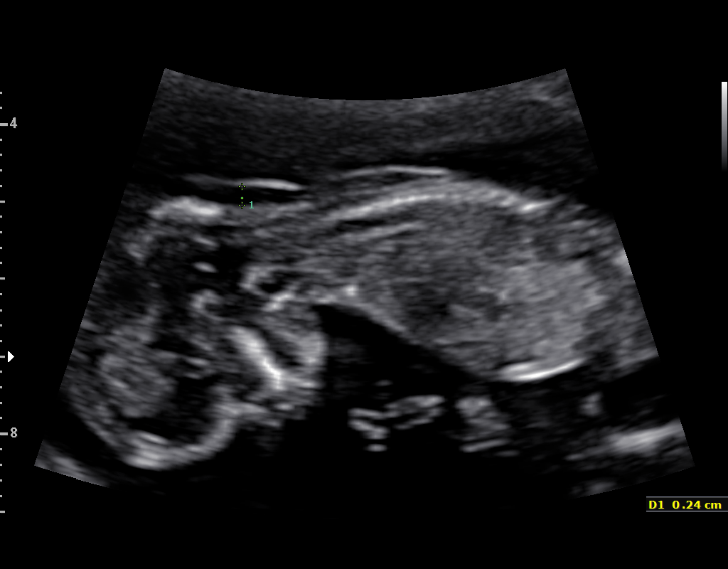
[im 44/52]
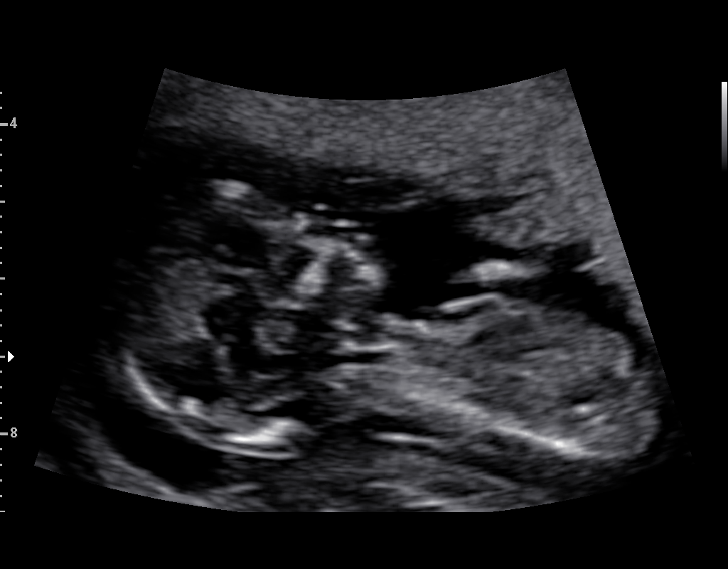
[im 48/52]
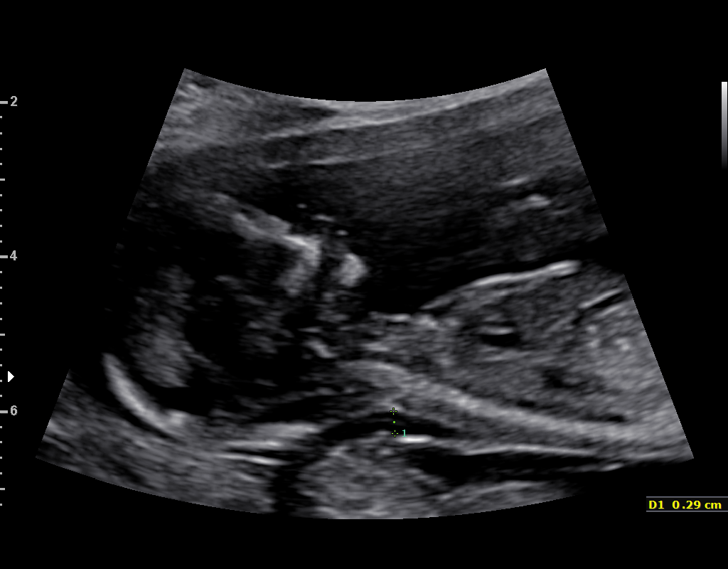
[im 52/52]
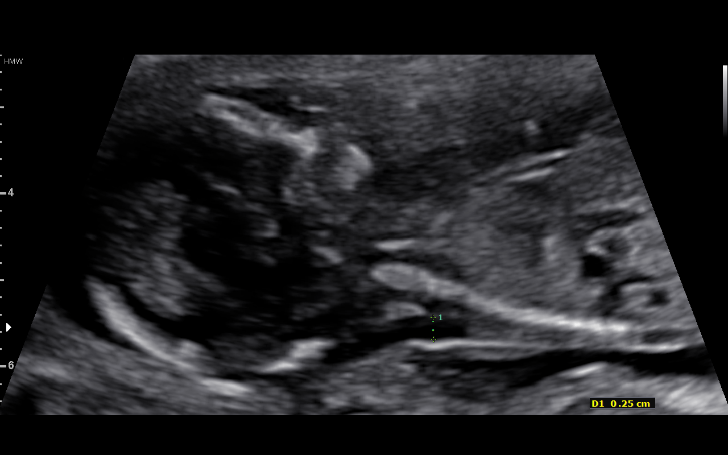

[14 of 28 positions shown; findings below may reference images not displayed]

TRANSLUCENCY

1  BHEBHE                 [id number]      [id number]     [id number]
CEEJAY
Indications

13 weeks gestation of pregnancy
Encounter for antenatal screening for
malformations
OB History

Gravidity:    2         Term:   1
Living:       1
Fetal Evaluation

Num Of Fetuses:     1
Preg. Location:     Intrauterine
Gest. Sac:          Intrauterine
Yolk Sac:           Not visualized
Fetal Pole:         Visualized
Fetal Heart         155
Rate(bpm):
Cardiac Activity:   Observed
Fetal Lie:          Variable
Placenta:           Anterior

Amniotic Fluid
AFI FV:      Subjectively within normal limits
Biometry

CRL:      82.4  mm     G. Age:  13w 5d                  EDD:   08/10/16
Gestational Age

LMP:           13w 0d       Date:   11/09/15                 EDD:   08/15/16
Best:          13w 0d    Det. By:   LMP  (11/09/15)          EDD:   08/15/16
1st Trimester Genetic Sonogram Screening

CRL:            82.4  mm    G. Age:   13w 5d                 EDD:   08/10/16
Nuc Trans:       2.5  mm
Nasal Bone:                 Present
Anatomy

Cranium:               Appears normal         Bladder:                Appears normal
Choroid Plexus:        Appears normal         Upper Extremities:      Visualized
Thoracic:              Appears normal         Lower Extremities:      Visualized
Stomach:               Appears normal, left
sided
Cervix Uterus Adnexa

Cervix
Normal appearance by transabdominal scan.

Uterus
No abnormality visualized.

Left Ovary
No adnexal mass visualized.
Impression

SIUP at 13+0 weeks
No gross abnormalities identified
NT measurement was within normal limits for this GA; NB
present
Normal amniotic fluid volume
Measurements consistent with LMP dating
Recommendations

Offer MSAFP in the second trimester for ONTD screening
Offer anatomy U/S by 18 weeks
